# Patient Record
Sex: Female | Born: 1990 | Race: White | Hispanic: No | Smoking: Former smoker
Health system: Southern US, Community
[De-identification: ages and names within clinical notes are randomized; demographics above are authoritative.]

## PROBLEM LIST (undated history)

## (undated) DIAGNOSIS — K219 Gastro-esophageal reflux disease without esophagitis: Secondary | ICD-10-CM

## (undated) DIAGNOSIS — K5792 Diverticulitis of intestine, part unspecified, without perforation or abscess without bleeding: Secondary | ICD-10-CM

## (undated) DIAGNOSIS — N201 Calculus of ureter: Secondary | ICD-10-CM

## (undated) DIAGNOSIS — R319 Hematuria, unspecified: Secondary | ICD-10-CM

## (undated) DIAGNOSIS — R109 Unspecified abdominal pain: Secondary | ICD-10-CM

## (undated) DIAGNOSIS — R3915 Urgency of urination: Secondary | ICD-10-CM

## (undated) DIAGNOSIS — T7840XA Allergy, unspecified, initial encounter: Secondary | ICD-10-CM

## (undated) DIAGNOSIS — R350 Frequency of micturition: Secondary | ICD-10-CM

## (undated) DIAGNOSIS — R351 Nocturia: Secondary | ICD-10-CM

## (undated) HISTORY — DX: Gastro-esophageal reflux disease without esophagitis: K21.9

## (undated) HISTORY — DX: Allergy, unspecified, initial encounter: T78.40XA

---
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
## 2010-10-06 HISTORY — PX: WISDOM TOOTH EXTRACTION: SHX21

## 2012-01-05 HISTORY — PX: EXTRACORPOREAL SHOCK WAVE LITHOTRIPSY: SHX1557

## 2012-04-23 ENCOUNTER — Other Ambulatory Visit: Payer: Self-pay | Admitting: Family Medicine

## 2012-04-23 DIAGNOSIS — M545 Low back pain, unspecified: Secondary | ICD-10-CM

## 2012-05-03 ENCOUNTER — Ambulatory Visit
Admission: RE | Admit: 2012-05-03 | Discharge: 2012-05-03 | Disposition: A | Discharge status: Home / Self Care | Payer: No Typology Code available for payment source | Source: Ambulatory Visit | Attending: Family Medicine | Admitting: Family Medicine

## 2012-05-03 DIAGNOSIS — M545 Low back pain, unspecified: Secondary | ICD-10-CM

## 2012-05-26 ENCOUNTER — Other Ambulatory Visit: Payer: Self-pay | Admitting: Urology

## 2012-06-08 NOTE — Progress Notes (Signed)
PT STATES THINKS SHE HAS PASSED STONE. SHE CALLING OFFICE TODAY. WE NEED TO CALL BACK ON Thursday 06-10-2012 IF CASE NOT CANCELLED YET.

## 2012-06-11 ENCOUNTER — Encounter (HOSPITAL_BASED_OUTPATIENT_CLINIC_OR_DEPARTMENT_OTHER): Payer: Self-pay | Admitting: *Deleted

## 2012-06-11 NOTE — Progress Notes (Signed)
NPO AFTER MN. ARRIVES AT 0600. NEEDS KUB, HG AND URINE PREG. MAY TAKE HYDROCODONE IF NEEDED W/ SIP OF WATER AM OF SURG.

## 2012-06-12 NOTE — H&P (Signed)
History of Present Illness        Nephrolithiasis: She reported low back pain with radiation into the abdomen as well as associated nausea, vomiting and hematuria. When she was evaluated on 04/22/12 her urinalysis was clear microscopically of blood cells. A CT scan done on 04/23/12 revealed a punctate right renal calculus of no clinical significance as well as a 2.5 mm calcification located near the left ureterovesical junction without associated hydronephrosis. It is difficult to tell whether this is a phlebolith or a distal ureteral stone. She reports that she began having abdominal pain in 6/13. This was associated with gross hematuria. Her gross hematuria seems to of cleared but she now has developed significant frequency, a feeling of needing to strain to urinate at times as well as pain in her lower back on the left-hand side. She also had associated nausea and vomiting initially but that has resolved. She did have a stone in 2/13 that required stenting and lithotripsy on the left-hand side in [facility].  Her CT scan also revealed what was described by the radiologist as a possible calcification in the area of the Bartholin's gland. She denies any pain in this region.   Past Medical History Problems  1. History of  Nephrolithiasis V13.01 2. History of  No Medical Problems  Surgical History Problems  1. History of  Lithotripsy 2. History of  Lithotripsy 3. History of  Oral Surgery Tooth Extraction  Current Meds 1. No Medications; Therapy: (Recorded:19Aug2013) to  Allergies Medication  1. No Known Drug Allergies  Family History Problems  1. Family history of  Breast Cancer V16.3 2. Paternal grandmother's history of  Breast Cancer V16.3 3. Maternal grandfather's history of  Esophageal Cancer V16.0 4. Family history of  Family Health Status Of Father - Alive 5. Family history of  Family Health Status Of Mother - Alive 6. Maternal uncle's history of  Malignant Melanoma Of The Skin  V16.8 7. Maternal history of  Nephrolithiasis  Social History Problems    Alcohol Use   Caffeine Use   Former Smoker V15.82   Marital History - Single  Review of Systems Genitourinary, constitutional, skin, eye, otolaryngeal, hematologic/lymphatic, cardiovascular, pulmonary, endocrine, musculoskeletal, gastrointestinal, neurological and psychiatric system(s) were reviewed and pertinent findings if present are noted.  Genitourinary: urinary frequency, urinary urgency, dysuria, nocturia, incontinence, urinary hesitancy, urinary stream starts and stops, hematuria, dyspareunia and initiating urination requires straining.  Gastrointestinal: nausea and constipation.  Constitutional: feeling tired (fatigue) and recent weight loss.  Hematologic/Lymphatic: a tendency to easily bruise.  Cardiovascular: chest pain.  Respiratory: shortness of breath.  Musculoskeletal: back pain.  Neurological: dizziness and headache.    Vitals Vital Signs  BMI Calculated: 23.79 BSA Calculated: 1.55 Height: 5 ft 1 in Weight: 126 lb  Blood Pressure: 96 / 62 Temperature: 97.7 F Heart Rate: 57  Physical Exam Constitutional: Well nourished and well developed . No acute distress.  ENT:. The ears and nose are normal in appearance.  Neck: The appearance of the neck is normal and no neck mass is present.  Pulmonary: No respiratory distress and normal respiratory rhythm and effort.  Cardiovascular: Heart rate and rhythm are normal . No peripheral edema.  Abdomen: The abdomen is soft and nontender. No masses are palpated. No CVA tenderness. No hernias are palpable. No hepatosplenomegaly noted.  Lymphatics: The femoral and inguinal nodes are not enlarged or tender.  Skin: Normal skin turgor, no visible rash and no visible skin lesions.  Neuro/Psych:. Mood and affect are  appropriate.  Genitourinary:  Chaperone Present: .  Examination of the external genitalia shows normal female external genitalia and no  lesions. The urethra is normal in appearance and not tender. There is no urethral mass. Vaginal exam demonstrates no abnormalities. The adnexa are palpably normal. The bladder is non tender and not distended. The anus is normal on inspection. The perineum is normal on inspection.    Results/Data Urine [Data Includes: Last 1 Day]   19Aug2013  COLOR YELLOW   APPEARANCE CLEAR   SPECIFIC GRAVITY 1.010   pH 6.0   GLUCOSE NEG mg/dL  BILIRUBIN NEG   KETONE NEG mg/dL  BLOOD TRACE   PROTEIN NEG mg/dL  UROBILINOGEN 0.2 mg/dL  NITRITE NEG   LEUKOCYTE ESTERASE TRACE   SQUAMOUS EPITHELIAL/HPF RARE   WBC NONE SEEN WBC/hpf  RBC 0-2 RBC/hpf  BACTERIA NONE SEEN   CRYSTALS NONE SEEN   CASTS NONE SEEN    Old records or history reviewed: Notes from Dr. Langston Reusing office as above.  The following images/tracing/specimen were independently visualized:  CT scan as above.  The following clinical lab reports were reviewed:  Urinalysis as above.  The following radiology reports were reviewed: CT scan.    Assessment Assessed  1. Nephrolithiasis Of The Right Kidney 592.0 2. Possible  Distal Ureteral Stone On The Left 592.1         Her urinalysis today is completely clear. However she continues to have significant irritative voiding symptoms and also has had symptoms very suggestive of the passage of a stone. The calcification in her lower pelvis on the left-hand side near the UVJ could potentially be a phlebolith but with her voiding symptoms I think it's entirely possible that it is a distal ureteral stone. I have proposed either performing a CT scan with contrast which would allow me to diagnose the calcification is a stone versus perform a retrograde pyelogram and if it was a stone treat the stone at that time with extraction and possible stent. I found no abnormality whatsoever on physical examination in the area of the Bartholin's gland/Skene's gland region that was noted as a calcification on her CT  scan. She has elected to proceed with cystoscopy, retrograde pyelogram and stone extraction if the calcification in her pelvis left-hand side is in fact a stone which I think it's probably going to be. We discussed the possible need for a stent and also the ability to evaluate the bladder for possible irritative focus if this is not a stone.   Plan    She'll be scheduled for cystoscopy with left retrograde pyelogram, left ureteroscopy and stone extraction as indicated.

## 2012-06-14 ENCOUNTER — Ambulatory Visit (HOSPITAL_COMMUNITY): Payer: BC Managed Care – PPO

## 2012-06-14 ENCOUNTER — Encounter (HOSPITAL_BASED_OUTPATIENT_CLINIC_OR_DEPARTMENT_OTHER): Payer: Self-pay | Admitting: Anesthesiology

## 2012-06-14 ENCOUNTER — Encounter (HOSPITAL_BASED_OUTPATIENT_CLINIC_OR_DEPARTMENT_OTHER): Payer: Self-pay | Admitting: *Deleted

## 2012-06-14 ENCOUNTER — Ambulatory Visit (HOSPITAL_BASED_OUTPATIENT_CLINIC_OR_DEPARTMENT_OTHER): Payer: BC Managed Care – PPO | Admitting: Anesthesiology

## 2012-06-14 ENCOUNTER — Encounter (HOSPITAL_BASED_OUTPATIENT_CLINIC_OR_DEPARTMENT_OTHER)
Admission: RE | Disposition: A | Discharge status: Home / Self Care | Payer: Self-pay | Source: Ambulatory Visit | Attending: Urology

## 2012-06-14 ENCOUNTER — Ambulatory Visit (HOSPITAL_BASED_OUTPATIENT_CLINIC_OR_DEPARTMENT_OTHER)
Admission: RE | Admit: 2012-06-14 | Discharge: 2012-06-14 | Disposition: A | Discharge status: Home / Self Care | Payer: BC Managed Care – PPO | Source: Ambulatory Visit | Attending: Urology | Admitting: Urology

## 2012-06-14 DIAGNOSIS — R112 Nausea with vomiting, unspecified: Secondary | ICD-10-CM | POA: Insufficient documentation

## 2012-06-14 DIAGNOSIS — M545 Low back pain, unspecified: Secondary | ICD-10-CM | POA: Insufficient documentation

## 2012-06-14 DIAGNOSIS — R319 Hematuria, unspecified: Secondary | ICD-10-CM | POA: Insufficient documentation

## 2012-06-14 DIAGNOSIS — N201 Calculus of ureter: Secondary | ICD-10-CM

## 2012-06-14 DIAGNOSIS — I998 Other disorder of circulatory system: Secondary | ICD-10-CM | POA: Insufficient documentation

## 2012-06-14 HISTORY — DX: Unspecified abdominal pain: R10.9

## 2012-06-14 HISTORY — DX: Calculus of ureter: N20.1

## 2012-06-14 HISTORY — DX: Frequency of micturition: R35.0

## 2012-06-14 HISTORY — PX: CYSTOSCOPY WITH URETEROSCOPY: SHX5123

## 2012-06-14 HISTORY — DX: Urgency of urination: R39.15

## 2012-06-14 HISTORY — DX: Nocturia: R35.1

## 2012-06-14 HISTORY — PX: CYSTOSCOPY W/ RETROGRADES: SHX1426

## 2012-06-14 HISTORY — DX: Hematuria, unspecified: R31.9

## 2012-06-14 LAB — POCT PREGNANCY, URINE: Preg Test, Ur: NEGATIVE

## 2012-06-14 LAB — POCT HEMOGLOBIN-HEMACUE: Hemoglobin: 13.6 g/dL (ref 12.0–15.0)

## 2012-06-14 SURGERY — CYSTOSCOPY, WITH RETROGRADE PYELOGRAM
Anesthesia: General | Site: Ureter | Laterality: Left | Wound class: Clean Contaminated

## 2012-06-14 MED ORDER — ONDANSETRON HCL 4 MG/2ML IJ SOLN
INTRAMUSCULAR | Status: DC | PRN
  Administered 2012-06-14: 4 mg via INTRAVENOUS

## 2012-06-14 MED ORDER — DEXAMETHASONE SODIUM PHOSPHATE 4 MG/ML IJ SOLN
INTRAMUSCULAR | Status: DC | PRN
  Administered 2012-06-14: 10 mg via INTRAVENOUS

## 2012-06-14 MED ORDER — CIPROFLOXACIN IN D5W 200 MG/100ML IV SOLN
200.0000 mg | INTRAVENOUS | Status: AC
  Administered 2012-06-14: 200 mg via INTRAVENOUS

## 2012-06-14 MED ORDER — FENTANYL CITRATE 0.05 MG/ML IJ SOLN
INTRAMUSCULAR | Status: DC | PRN
  Administered 2012-06-14: 50 ug via INTRAVENOUS

## 2012-06-14 MED ORDER — IOHEXOL 350 MG/ML SOLN
INTRAVENOUS | Status: DC | PRN
  Administered 2012-06-14: 6 mL

## 2012-06-14 MED ORDER — MEPERIDINE HCL 25 MG/ML IJ SOLN: 6.2500 mg | INTRAMUSCULAR | Status: DC | PRN

## 2012-06-14 MED ORDER — MIDAZOLAM HCL 5 MG/5ML IJ SOLN
INTRAMUSCULAR | Status: DC | PRN
  Administered 2012-06-14: 2 mg via INTRAVENOUS

## 2012-06-14 MED ORDER — LACTATED RINGERS IV SOLN: INTRAVENOUS | Status: DC

## 2012-06-14 MED ORDER — LACTATED RINGERS IV SOLN
INTRAVENOUS | Status: DC
  Administered 2012-06-14 (×2): via INTRAVENOUS

## 2012-06-14 MED ORDER — FENTANYL CITRATE 0.05 MG/ML IJ SOLN: 25.0000 ug | INTRAMUSCULAR | Status: DC | PRN

## 2012-06-14 MED ORDER — PROPOFOL 10 MG/ML IV BOLUS
INTRAVENOUS | Status: DC | PRN
  Administered 2012-06-14: 140 mg via INTRAVENOUS

## 2012-06-14 MED ORDER — LIDOCAINE HCL (CARDIAC) 20 MG/ML IV SOLN
INTRAVENOUS | Status: DC | PRN
  Administered 2012-06-14: 70 mg via INTRAVENOUS

## 2012-06-14 MED ORDER — PHENAZOPYRIDINE HCL 200 MG PO TABS: 200.0000 mg | ORAL_TABLET | Freq: Once | ORAL | Status: DC

## 2012-06-14 MED ORDER — PROMETHAZINE HCL 25 MG/ML IJ SOLN: 6.2500 mg | INTRAMUSCULAR | Status: DC | PRN

## 2012-06-14 MED ORDER — SODIUM CHLORIDE 0.9 % IR SOLN
Status: DC | PRN
  Administered 2012-06-14: 6000 mL

## 2012-06-14 MED ORDER — HYDROCODONE-ACETAMINOPHEN 5-325 MG PO TABS
1.0000 | ORAL_TABLET | Freq: Four times a day (QID) | ORAL | Status: DC | PRN
  Administered 2012-06-14: 0.5 via ORAL

## 2012-06-14 SURGICAL SUPPLY — 43 items
ADAPTER CATH URET PLST 4-6FR (CATHETERS) ×2 IMPLANT
ADPR CATH URET STRL DISP 4-6FR (CATHETERS) ×2
BAG DRAIN URO-CYSTO SKYTR STRL (DRAIN) ×3 IMPLANT
BAG DRN UROCATH (DRAIN) ×2
BASKET LASER NITINOL 1.9FR (BASKET) IMPLANT
BASKET SEGURA 3FR (UROLOGICAL SUPPLIES) IMPLANT
BASKET STNLS GEMINI 4WIRE 3FR (BASKET) IMPLANT
BASKET ZERO TIP NITINOL 2.4FR (BASKET) IMPLANT
BRUSH URET BIOPSY 3F (UROLOGICAL SUPPLIES) IMPLANT
BSKT STON RTRVL 120 1.9FR (BASKET)
BSKT STON RTRVL GEM 120X11 3FR (BASKET)
BSKT STON RTRVL ZERO TP 2.4FR (BASKET)
CANISTER SUCT LVC 12 LTR MEDI- (MISCELLANEOUS) ×2 IMPLANT
CATH INTERMIT  6FR 70CM (CATHETERS) ×2 IMPLANT
CATH URET 5FR 28IN CONE TIP (BALLOONS)
CATH URET 5FR 70CM CONE TIP (BALLOONS) IMPLANT
CLOTH BEACON ORANGE TIMEOUT ST (SAFETY) ×3 IMPLANT
DRAPE CAMERA CLOSED 9X96 (DRAPES) ×3 IMPLANT
ELECT REM PT RETURN 9FT ADLT (ELECTROSURGICAL)
ELECTRODE REM PT RTRN 9FT ADLT (ELECTROSURGICAL) IMPLANT
GLOVE BIO SURGEON STRL SZ 6.5 (GLOVE) ×2 IMPLANT
GLOVE BIO SURGEON STRL SZ8 (GLOVE) ×3 IMPLANT
GLOVE ECLIPSE 6.0 STRL STRAW (GLOVE) ×2 IMPLANT
GOWN PREVENTION PLUS LG XLONG (DISPOSABLE) ×3 IMPLANT
GOWN STRL REIN XL XLG (GOWN DISPOSABLE) ×3 IMPLANT
GOWN XL W/COTTON TOWEL STD (GOWNS) ×1 IMPLANT
GUIDEWIRE 0.038 PTFE COATED (WIRE) ×3 IMPLANT
GUIDEWIRE ANG ZIPWIRE 038X150 (WIRE) IMPLANT
GUIDEWIRE STR DUAL SENSOR (WIRE) ×3 IMPLANT
IV NS IRRIG 3000ML ARTHROMATIC (IV SOLUTION) ×6 IMPLANT
KIT BALLIN UROMAX 15FX10 (LABEL) IMPLANT
KIT BALLN UROMAX 15FX4 (MISCELLANEOUS) IMPLANT
KIT BALLN UROMAX 26 75X4 (MISCELLANEOUS)
LASER FIBER DISP (UROLOGICAL SUPPLIES) IMPLANT
LASER FIBER DISP 1000U (UROLOGICAL SUPPLIES) IMPLANT
PACK CYSTOSCOPY (CUSTOM PROCEDURE TRAY) ×3 IMPLANT
SET HIGH PRES BAL DIL (LABEL)
SHEATH ACCESS URETERAL 38CM (SHEATH) IMPLANT
SHEATH ACCESS URETERAL 54CM (SHEATH) IMPLANT
SHEATH URET ACCESS 12FR/35CM (UROLOGICAL SUPPLIES) IMPLANT
SHEATH URET ACCESS 12FR/55CM (UROLOGICAL SUPPLIES) IMPLANT
SYRINGE IRR TOOMEY STRL 70CC (SYRINGE) IMPLANT
WATER STERILE IRR 3000ML UROMA (IV SOLUTION) IMPLANT

## 2012-06-14 NOTE — Anesthesia Preprocedure Evaluation (Addendum)
Anesthesia Evaluation  Patient identified by MRN, date of birth, ID band Patient awake    Reviewed: Allergy & Precautions, H&P , NPO status , Patient's Chart, lab work & pertinent test results  Airway Mallampati: II TM Distance: >3 FB Neck ROM: full    Dental No notable dental hx.    Pulmonary neg pulmonary ROS,  breath sounds clear to auscultation  Pulmonary exam normal       Cardiovascular Exercise Tolerance: Good negative cardio ROS  Rhythm:regular Rate:Normal     Neuro/Psych negative neurological ROS  negative psych ROS   GI/Hepatic negative GI ROS, Neg liver ROS,   Endo/Other  negative endocrine ROS  Renal/GU negative Renal ROS  negative genitourinary   Musculoskeletal   Abdominal   Peds  Hematology negative hematology ROS (+)   Anesthesia Other Findings   Reproductive/Obstetrics negative OB ROS                           Anesthesia Physical Anesthesia Plan  ASA: I  Anesthesia Plan: General LMA   Post-op Pain Management:    Induction:   Airway Management Planned:   Additional Equipment:   Intra-op Plan:   Post-operative Plan:   Informed Consent: I have reviewed the patients History and Physical, chart, labs and discussed the procedure including the risks, benefits and alternatives for the proposed anesthesia with the patient or authorized representative who has indicated his/her understanding and acceptance.   Dental Advisory Given  Plan Discussed with: CRNA  Anesthesia Plan Comments:         Anesthesia Quick Evaluation  

## 2012-06-14 NOTE — Op Note (Signed)
PATIENT:  Alexandra Cortez  PRE-OPERATIVE DIAGNOSIS:  Left Ureteral calculus  POST-OPERATIVE DIAGNOSIS: Left pelvic Phlebolith  PROCEDURE:  1. Cystoscopy. 2. Left retrograde pyelogram with interpretation 3. Left ureteroscopy  SURGEON: Garnett Farm, MD  INDICATION: Alexandra Cortez is a 21 year old female who had experienced irritative voiding symptoms as well as low back pain with associated nausea, vomiting and hematuria. A CT scan done on 04/23/12 revealed a punctate right renal calculus as well as a 2.5 mm calcification near the left ureteral vesicle junction without associated hydronephrosis. Followup KUB revealed a calcification was unchanged. She therefore is brought to the operating room for retrograde pyelogram, ureteroscopy and stone extraction if necessary.  ANESTHESIA:  General  EBL:  None   DESCRIPTION OF PROCEDURE: The patient was taken to the major OR and placed on the table. General anesthesia was administered and then the patient was moved to the dorsal lithotomy position. The genitalia was sterilely prepped and draped. An official timeout was performed.  Initially the 22 French cystoscope with 12 lens was passed under direct vision. The bladder was fully inspected. It was noted be free of any tumors stones or inflammatory lesions. Ureteral orifices were of normal configuration and position. A 6 French open-ended ureteral catheter was then passed through the cystoscope into the ureteral orifice in order to perform a left retrograde pyelogram.  A retrograde pyelogram was performed by injecting full-strength contrast up the left ureter under direct fluoroscopic control. It revealed what appeared to be an entirely normal ureter throughout its course. I could not definitely discern any filling defect although the calcification seen on her KUB was not easily visible under fluoroscopy. I therefore elected to proceed with diagnostic ureteroscopy.  The 6 French rigid ureteroscope was  then passed under direct vision into the bladder. The left ureteral orifice easily accepted the scope without the need for any dilatation. I advanced the scope under direct vision with minimal flow and noted no stones or other abnormalities within the ureter. Ureteroscope was therefore removed, the bladder drained and the patient was awakened and taken to recovery room in stable and satisfactory condition. She tolerated the procedure well with no intraoperative complications.  PLAN OF CARE: Discharge to home after PACU  PATIENT DISPOSITION:  PACU - hemodynamically stable.

## 2012-06-14 NOTE — Interval H&P Note (Signed)
History and Physical Interval Note:  06/14/2012 7:26 AM  Alexandra Cortez  has presented today for surgery, with the diagnosis of Possible Left Ureteral Stone  The various methods of treatment have been discussed with the patient and family. After consideration of risks, benefits and other options for treatment, the patient has consented to  Procedure(s) (LRB) with comments: CYSTOSCOPY/RETROGRADE/URETEROSCOPY (Left) - 1 hour requested for this case  C-ARM CAMERA DIGITAL URETEROSCOPE HOLMIUM LASER APPLICATION (Left) as a surgical intervention .  The patient's history has been reviewed, patient examined, no change in status, stable for surgery.  I have reviewed the patient's chart and labs.  Questions were answered to the patient's satisfaction.     Garnett Farm

## 2012-06-14 NOTE — Anesthesia Procedure Notes (Signed)
Procedure Name: LMA Insertion Date/Time: 06/14/2012 7:32 AM Performed by: Maris Berger T Pre-anesthesia Checklist: Patient identified, Emergency Drugs available, Suction available and Patient being monitored Patient Re-evaluated:Patient Re-evaluated prior to inductionOxygen Delivery Method: Circle System Utilized Preoxygenation: Pre-oxygenation with 100% oxygen Intubation Type: IV induction Ventilation: Mask ventilation without difficulty LMA: LMA inserted LMA Size: 4.0 Number of attempts: 1 Placement Confirmation: positive ETCO2 Dental Injury: Teeth and Oropharynx as per pre-operative assessment  Comments: Gauze roll between teeth

## 2012-06-14 NOTE — Anesthesia Postprocedure Evaluation (Signed)
  Anesthesia Post-op Note  Patient: Alexandra Cortez  Procedure(s) Performed: Procedure(s) (LRB): CYSTOSCOPY WITH RETROGRADE PYELOGRAM (Left) CYSTOSCOPY WITH URETEROSCOPY (Left)  Patient Location: PACU  Anesthesia Type: General  Level of Consciousness: awake and alert   Airway and Oxygen Therapy: Patient Spontanous Breathing  Post-op Pain: mild  Post-op Assessment: Post-op Vital signs reviewed, Patient's Cardiovascular Status Stable, Respiratory Function Stable, Patent Airway and No signs of Nausea or vomiting  Post-op Vital Signs: stable  Complications: No apparent anesthesia complications

## 2012-06-14 NOTE — Transfer of Care (Signed)
Immediate Anesthesia Transfer of Care Note  Patient: Alexandra Cortez  Procedure(s) Performed: Procedure(s) (LRB) with comments: CYSTOSCOPY WITH RETROGRADE PYELOGRAM (Left) CYSTOSCOPY WITH URETEROSCOPY (Left)  Patient Location: PACU  Anesthesia Type: General  Level of Consciousness: unresponsive  Airway & Oxygen Therapy: Patient Spontanous Breathing and Patient connected to nasal cannula oxygen  Post-op Assessment: Report given to PACU RN and Post -op Vital signs reviewed and stable  Post vital signs: Reviewed and stable  Complications: No apparent anesthesia complications

## 2012-06-15 ENCOUNTER — Encounter (HOSPITAL_BASED_OUTPATIENT_CLINIC_OR_DEPARTMENT_OTHER): Payer: Self-pay | Admitting: Urology

## 2012-06-17 ENCOUNTER — Encounter (HOSPITAL_BASED_OUTPATIENT_CLINIC_OR_DEPARTMENT_OTHER): Payer: Self-pay

## 2012-07-20 ENCOUNTER — Other Ambulatory Visit: Payer: Self-pay | Admitting: Family Medicine

## 2012-07-20 DIAGNOSIS — R591 Generalized enlarged lymph nodes: Secondary | ICD-10-CM

## 2012-07-22 ENCOUNTER — Ambulatory Visit
Admission: RE | Admit: 2012-07-22 | Discharge: 2012-07-22 | Disposition: A | Discharge status: Home / Self Care | Payer: BC Managed Care – PPO | Source: Ambulatory Visit | Attending: Family Medicine | Admitting: Family Medicine

## 2012-07-22 DIAGNOSIS — R591 Generalized enlarged lymph nodes: Secondary | ICD-10-CM

## 2012-07-30 ENCOUNTER — Other Ambulatory Visit (HOSPITAL_COMMUNITY): Payer: Self-pay | Admitting: Family Medicine

## 2012-07-30 DIAGNOSIS — R599 Enlarged lymph nodes, unspecified: Secondary | ICD-10-CM

## 2012-08-02 ENCOUNTER — Other Ambulatory Visit: Payer: Self-pay | Admitting: Radiology

## 2012-08-03 ENCOUNTER — Encounter (HOSPITAL_COMMUNITY): Payer: Self-pay | Admitting: Pharmacy Technician

## 2012-08-03 ENCOUNTER — Encounter (HOSPITAL_COMMUNITY): Payer: Self-pay

## 2012-08-03 ENCOUNTER — Ambulatory Visit (HOSPITAL_COMMUNITY)
Admission: RE | Admit: 2012-08-03 | Discharge: 2012-08-03 | Disposition: A | Discharge status: Home / Self Care | Payer: BC Managed Care – PPO | Source: Ambulatory Visit | Attending: Family Medicine | Admitting: Family Medicine

## 2012-08-03 DIAGNOSIS — R599 Enlarged lymph nodes, unspecified: Secondary | ICD-10-CM

## 2012-08-03 LAB — PROTIME-INR: Prothrombin Time: 14.1 seconds (ref 11.6–15.2)

## 2012-08-03 LAB — CBC
HCT: 40.6 % (ref 36.0–46.0)
Hemoglobin: 13.6 g/dL (ref 12.0–15.0)
MCH: 29.1 pg (ref 26.0–34.0)
MCV: 86.9 fL (ref 78.0–100.0)
RBC: 4.67 MIL/uL (ref 3.87–5.11)

## 2012-08-03 MED ORDER — FENTANYL CITRATE 0.05 MG/ML IJ SOLN
INTRAMUSCULAR | Status: AC | PRN
  Administered 2012-08-03: 50 ug via INTRAVENOUS

## 2012-08-03 MED ORDER — FENTANYL CITRATE 0.05 MG/ML IJ SOLN
INTRAMUSCULAR | Status: AC
  Filled 2012-08-03: qty 4

## 2012-08-03 MED ORDER — MIDAZOLAM HCL 2 MG/2ML IJ SOLN
INTRAMUSCULAR | Status: AC
  Filled 2012-08-03: qty 4

## 2012-08-03 MED ORDER — MIDAZOLAM HCL 2 MG/2ML IJ SOLN
INTRAMUSCULAR | Status: AC | PRN
  Administered 2012-08-03: 1 mg via INTRAVENOUS

## 2012-08-03 MED ORDER — SODIUM CHLORIDE 0.9 % IV SOLN
INTRAVENOUS | Status: DC
  Administered 2012-08-03: 13:00:00 via INTRAVENOUS

## 2012-08-03 NOTE — H&P (Signed)
Alexandra Cortez is an 21 y.o. female.   Chief Complaint: pt has noticed Rt Inguinal LN approx 4-6 weeks ago Initially painful; not for few weeks; smaller CT and [place] in Epic reveals groin LAN Scheduled for lymph node biopsy  HPI: smoker; hx renal calculi  Past Medical History  Diagnosis Date  . Left ureteral calculus   . Frequency of urination   . Urgency of urination   . Hematuria   . Nocturia   . Left flank pain     Past Surgical History  Procedure Date  . Extracorporeal shock wave lithotripsy APR 2013    LEFT SIDE--  [facility]   . Wisdom tooth extraction 2012  . Cystoscopy w/ retrogrades 06/14/2012    Procedure: CYSTOSCOPY WITH RETROGRADE PYELOGRAM;  Surgeon: Garnett Farm, MD;  Location: [facility];  Service: Urology;  Laterality: Left;  . Cystoscopy with ureteroscopy 06/14/2012    Procedure: CYSTOSCOPY WITH URETEROSCOPY;  Surgeon: Garnett Farm, MD;  Location: [facility];  Service: Urology;  Laterality: Left;    No family history on file. Social History:  reports that she has been smoking Cigarettes.  She has a .3 pack-year smoking history. She has never used smokeless tobacco. She reports that she drinks alcohol. She reports that she uses illicit drugs (Marijuana).  Allergies: No Known Allergies   (Not in a hospital admission)  Results for orders placed during the hospital encounter of 08/03/12 (from the past 48 hour(s))  CBC     Status: Normal   Collection Time   08/03/12  1:13 PM      Component Value Range Comment   WBC 7.7  4.0 - 10.5 K/uL    RBC 4.67  3.87 - 5.11 MIL/uL    Hemoglobin 13.6  12.0 - 15.0 g/dL    HCT 08.6  57.8 - 46.9 %    MCV 86.9  78.0 - 100.0 fL    MCH 29.1  26.0 - 34.0 pg    MCHC 33.5  30.0 - 36.0 g/dL    RDW 62.9  52.8 - 41.3 %    Platelets 223  150 - 400 K/uL    No results found.  Review of Systems  Constitutional: Negative for fever.  Respiratory: Negative for shortness of breath.     Cardiovascular: Negative for chest pain.  Gastrointestinal: Negative for nausea, vomiting and abdominal pain.  Musculoskeletal:       Very small rt inguinal LN; NT  Neurological: Negative for headaches.    Blood pressure 99/54, pulse 71, temperature 98.4 F (36.9 C), temperature source Oral, resp. rate 20, height 5\' 1"  (1.549 m), weight 126 lb (57.153 kg), last menstrual period 06/03/2012, SpO2 100.00%. Physical Exam  Cardiovascular: Normal rate, regular rhythm and normal heart sounds.   No murmur heard. Respiratory: Effort normal and breath sounds normal. She has no wheezes.  GI: Soft. Bowel sounds are normal. There is no tenderness.  Musculoskeletal: Normal range of motion.       Rt inguinal LN; NT; 1cm  Neurological: She is alert.  Psychiatric: She has a normal mood and affect. Her behavior is normal. Judgment and thought content normal.     Assessment/Plan Noticed rt groin lymph node weeks ago; initially painful Not painful now and smaller CT and US reveals LAN Scheduled now for Rt groin LN bx Pt and family aware of procedure benefits and risks and agreeable to proceed Consent signed and in chart  Sheri Gatchel A 08/03/2012, 1:37 PM

## 2012-08-03 NOTE — H&P (Signed)
Agree with PA note.  Node highly likely benign and reactive given recent decrease in size.  Given easily accessible and low risk location, will proceed with biopsy to confirm.  Signed,  Sterling Big, MD Vascular & Interventional Radiologist [facility] Radiology

## 2012-08-04 ENCOUNTER — Telehealth (HOSPITAL_COMMUNITY): Payer: Self-pay | Admitting: *Deleted

## 2012-08-04 NOTE — Telephone Encounter (Signed)
Radiology post procedure phone call attempted.  Message oeft on identified answering to call for any problems or questions.

## 2013-05-17 ENCOUNTER — Other Ambulatory Visit: Payer: Self-pay | Admitting: Physician Assistant

## 2013-05-17 DIAGNOSIS — R51 Headache: Secondary | ICD-10-CM

## 2014-07-24 IMAGING — CT CT ABD-PELV W/O CM
1 of 2 series · 15 of 32 positions shown, 19 images · non-contrast
Comparison: None.

CLINICAL DATA: Low back pain and left flank pain.  Hematuria.
History of lithotripsy.

CT ABDOMEN AND PELVIS WITHOUT CONTRAST
TECHNIQUE: Multidetector CT imaging of the abdomen and pelvis was
performed following the standard protocol without intravenous
contrast.

[Series 3: renal stone · axial · 0.71mm/px · z∈[-342,+3]mm · 15 of 77 slices shown, 19 images]
[im 4/77  soft-tissue]
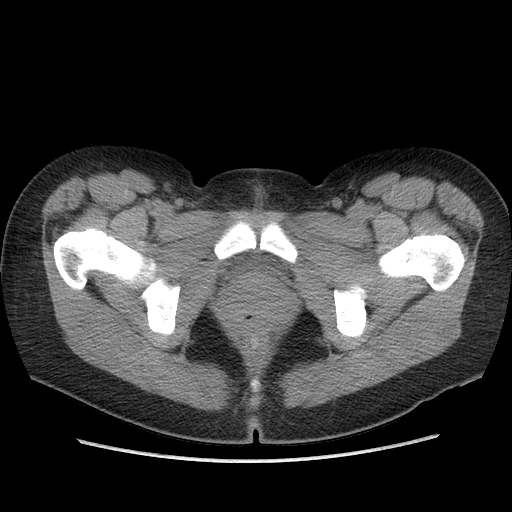
[im 4/77  bone]
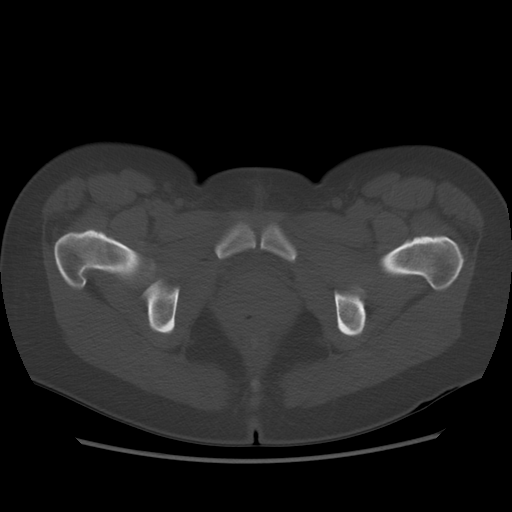
[im 10/77  soft-tissue]
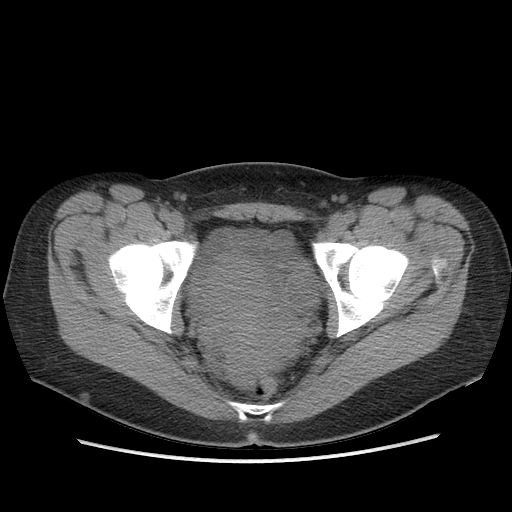
[im 16/77  soft-tissue]
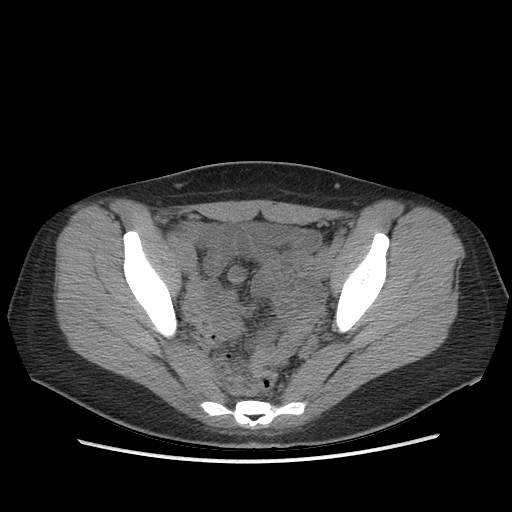
[im 22/77  soft-tissue]
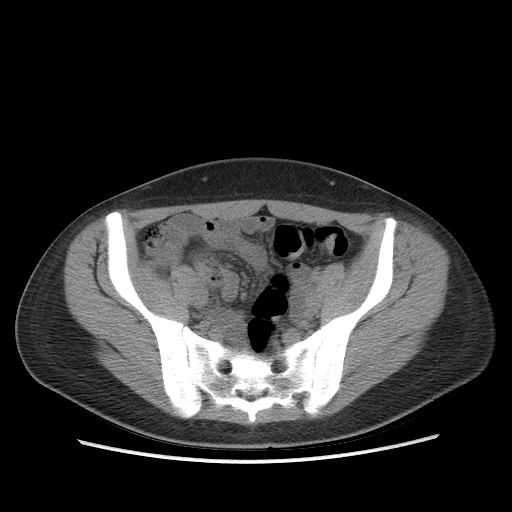
[im 28/77  soft-tissue]
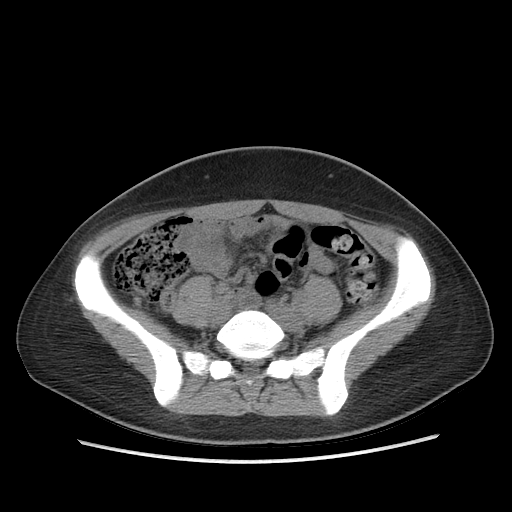
[im 34/77  soft-tissue]
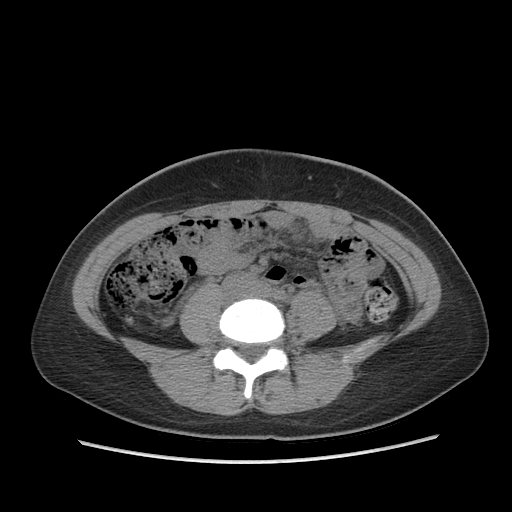
[im 40/77  soft-tissue]
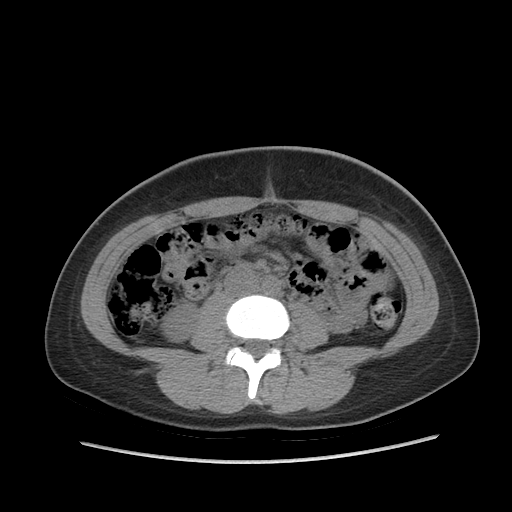
[im 43/77  soft-tissue]
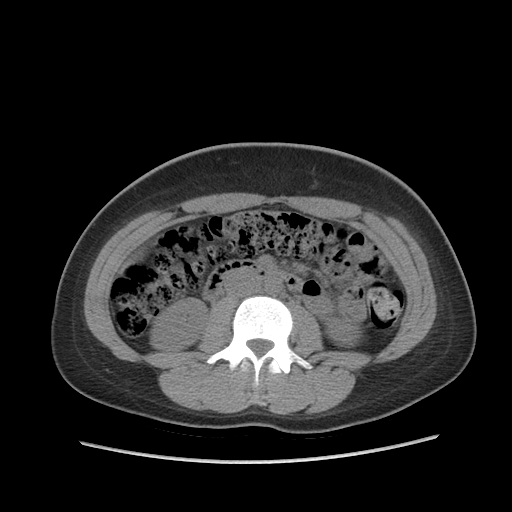
[im 49/77  soft-tissue]
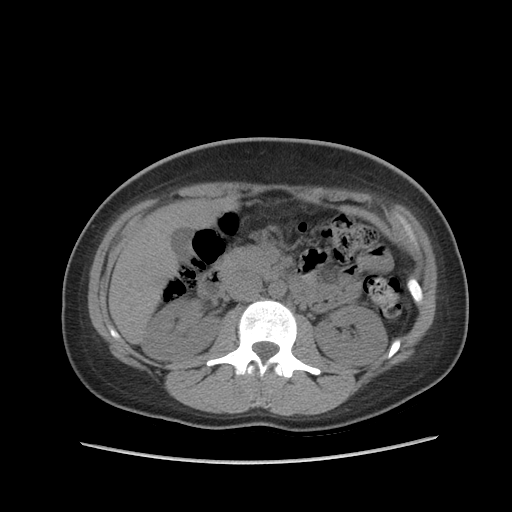
[im 49/77  bone]
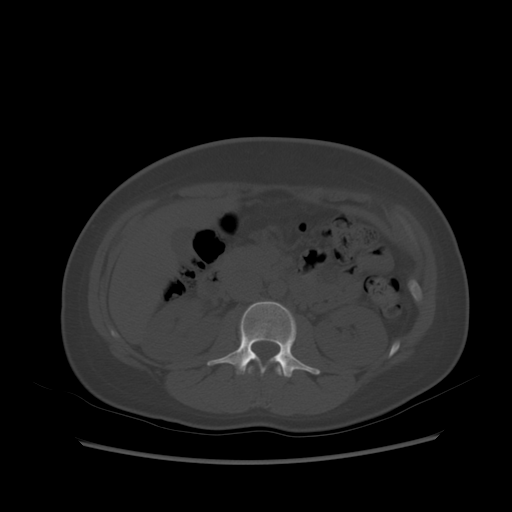
[im 55/77  soft-tissue]
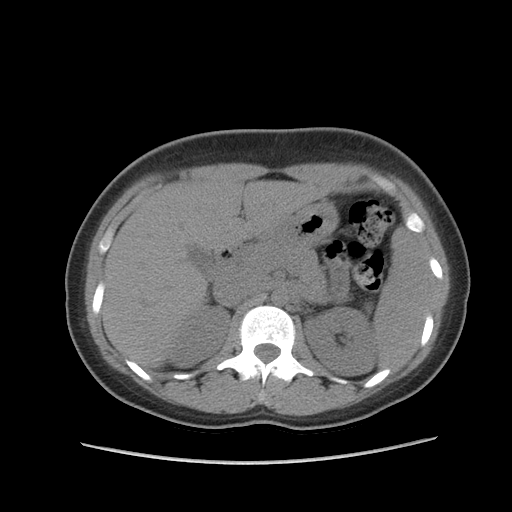
[im 61/77  soft-tissue]
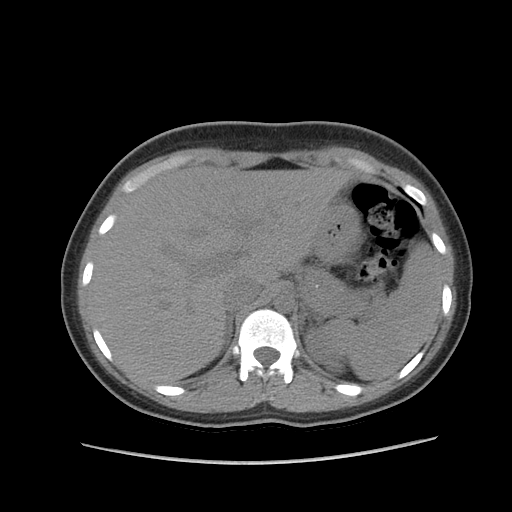
[im 64/77  lung]
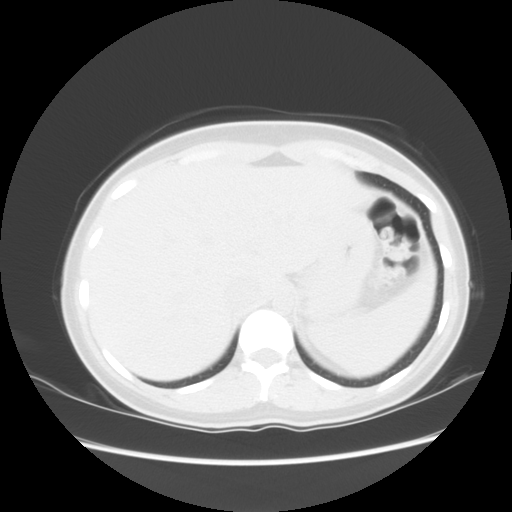
[im 67/77  soft-tissue]
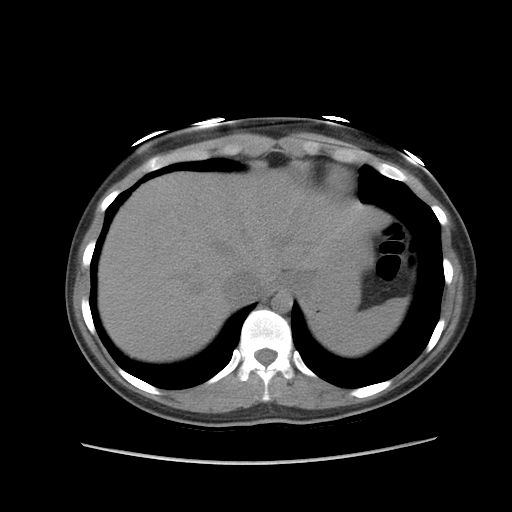
[im 67/77  lung]
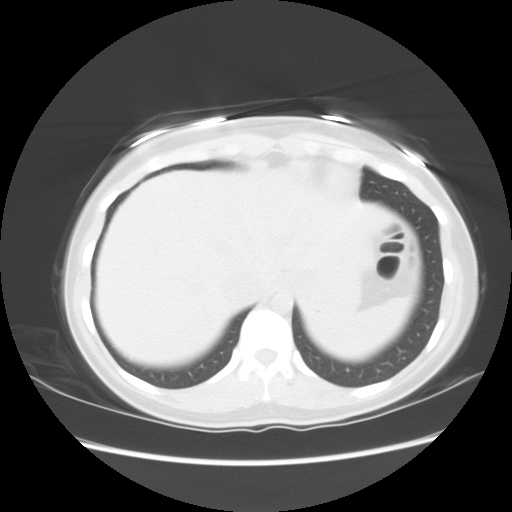
[im 70/77  lung]
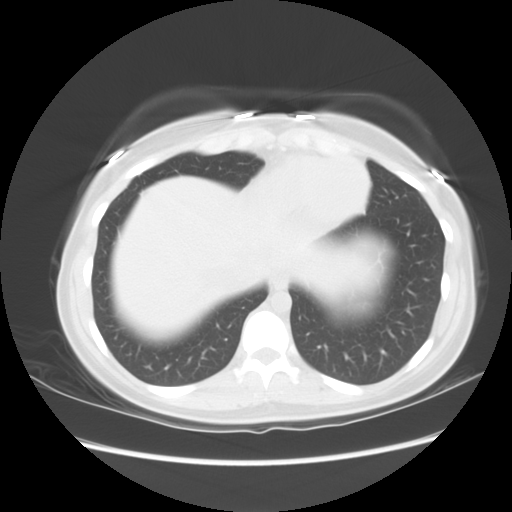
[im 73/77  soft-tissue]
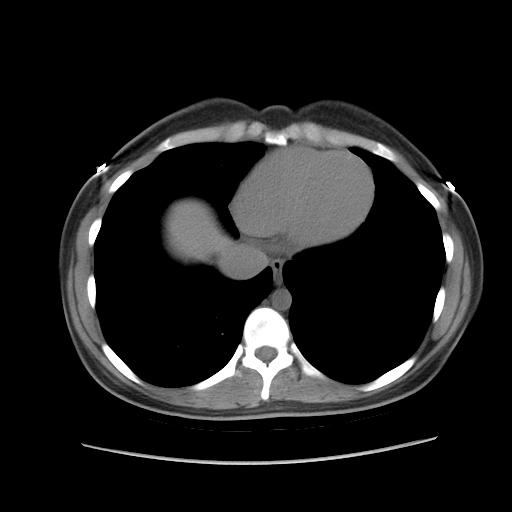
[im 73/77  lung]
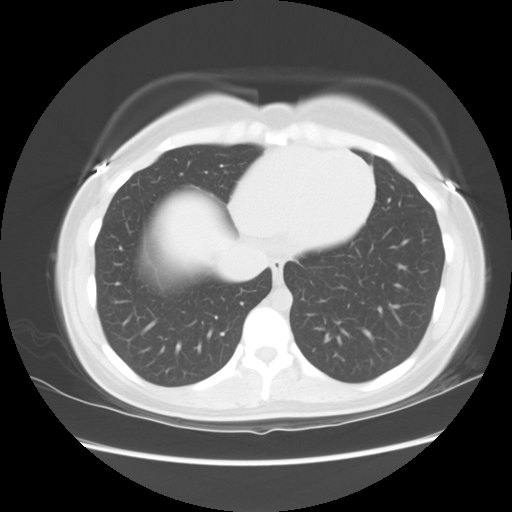

[15 of 32 positions shown; findings below may reference images not displayed]

FINDINGS: Lung bases show no acute findings.  Heart size normal.
No pericardial or pleural effusion.

Liver, gallbladder and adrenal glands unremarkable.  There is a
punctate stone in the right kidney (image 26).  Ureters are
decompressed.  Spleen, pancreas, stomach and bowel are
unremarkable.

There is a 3 mm calcification in the low left anatomic pelvis
(image 72), not definitively in the location of the left ureteral
vesicle junction.  Admittedly, the distal left ureter is not well
delineated.  Bladder is decompressed.  A second calcification is
seen more inferiorly, toward the midline, measuring 4 x 7 mm.

No definite free fluid.  No pathologically enlarged lymph nodes.
IMPRESSION: 1.  Probable phlebolith in the low left anatomic pelvis.  A left
ureteral vesicle junction stone cannot be definitively excluded.
No associated left hydronephrosis or edema.
2.  Punctate right renal stone.
3.  Small calcification in the region of a probable Bartholin's
gland cyst.

## 2015-04-15 ENCOUNTER — Encounter (HOSPITAL_COMMUNITY): Payer: Self-pay | Admitting: Emergency Medicine

## 2015-04-15 ENCOUNTER — Emergency Department (HOSPITAL_COMMUNITY): Payer: BLUE CROSS/BLUE SHIELD

## 2015-04-15 ENCOUNTER — Emergency Department (HOSPITAL_COMMUNITY)
Admission: EM | Admit: 2015-04-15 | Discharge: 2015-04-15 | Disposition: A | Discharge status: Home / Self Care | Payer: BLUE CROSS/BLUE SHIELD | Attending: Emergency Medicine | Admitting: Emergency Medicine

## 2015-04-15 DIAGNOSIS — R51 Headache: Secondary | ICD-10-CM | POA: Diagnosis present

## 2015-04-15 DIAGNOSIS — G43901 Migraine, unspecified, not intractable, with status migrainosus: Secondary | ICD-10-CM

## 2015-04-15 DIAGNOSIS — Z79899 Other long term (current) drug therapy: Secondary | ICD-10-CM | POA: Insufficient documentation

## 2015-04-15 DIAGNOSIS — Z720 Tobacco use: Secondary | ICD-10-CM | POA: Insufficient documentation

## 2015-04-15 DIAGNOSIS — Z87442 Personal history of urinary calculi: Secondary | ICD-10-CM | POA: Diagnosis not present

## 2015-04-15 DIAGNOSIS — G43001 Migraine without aura, not intractable, with status migrainosus: Secondary | ICD-10-CM | POA: Diagnosis not present

## 2015-04-15 DIAGNOSIS — R112 Nausea with vomiting, unspecified: Secondary | ICD-10-CM | POA: Diagnosis not present

## 2015-04-15 DIAGNOSIS — Z3202 Encounter for pregnancy test, result negative: Secondary | ICD-10-CM | POA: Diagnosis not present

## 2015-04-15 LAB — POC URINE PREG, ED: PREG TEST UR: NEGATIVE

## 2015-04-15 MED ORDER — SODIUM CHLORIDE 0.9 % IV BOLUS (SEPSIS)
1000.0000 mL | Freq: Once | INTRAVENOUS | Status: AC
  Administered 2015-04-15: 1000 mL via INTRAVENOUS

## 2015-04-15 MED ORDER — METOCLOPRAMIDE HCL 5 MG/ML IJ SOLN
10.0000 mg | Freq: Once | INTRAMUSCULAR | Status: AC
  Administered 2015-04-15: 10 mg via INTRAVENOUS
  Filled 2015-04-15: qty 2

## 2015-04-15 MED ORDER — DIPHENHYDRAMINE HCL 50 MG/ML IJ SOLN
25.0000 mg | Freq: Once | INTRAMUSCULAR | Status: AC
  Administered 2015-04-15: 25 mg via INTRAVENOUS
  Filled 2015-04-15: qty 1

## 2015-04-15 MED ORDER — MORPHINE SULFATE 4 MG/ML IJ SOLN
4.0000 mg | Freq: Once | INTRAMUSCULAR | Status: AC
  Administered 2015-04-15: 4 mg via INTRAVENOUS
  Filled 2015-04-15: qty 1

## 2015-04-15 NOTE — ED Notes (Signed)
Pt c/o headache onset Monday, pt states this differs from her typical migraine because when she lowers her head she feels extremely nauseated and her pain becomes significantly worse. Pt states that she has used the maximum allotted amount of her migraine medications, pt states that she usually has some nausea with migraines, but that this feels different.

## 2015-04-15 NOTE — Discharge Instructions (Signed)
Recurrent Migraine Headache °A migraine headache is very bad, throbbing pain on one or both sides of your head. Recurrent migraines keep coming back. Talk to your doctor about what things may bring on (trigger) your migraine headaches. °HOME CARE °· Only take medicines as told by your doctor. °· Lie down in a dark, quiet room when you have a migraine. °· Keep a journal to find out if certain things bring on migraine headaches. For example, write down: °¨ What you eat and drink. °¨ How much sleep you get. °¨ Any change to your diet or medicines. °· Lessen how much alcohol you drink. °· Quit smoking if you smoke. °· Get enough sleep. °· Lessen any stress in your life. °· Keep lights dim if bright lights bother you or make your migraines worse. °GET HELP IF: °· Medicine does not help your migraines. °· Your pain keeps coming back. °· You have a fever. °GET HELP RIGHT AWAY IF:  °· Your migraine becomes really bad. °· You have a stiff neck. °· You have trouble seeing. °· Your muscles are weak, or you lose muscle control. °· You lose your balance or have trouble walking. °· You feel like you will pass out (faint), or you pass out. °· You have really bad symptoms that are different than your first symptoms. °MAKE SURE YOU:  °· Understand these instructions. °· Will watch your condition. °· Will get help right away if you are not doing well or get worse. °Document Released: 07/01/2008 Document Revised: 09/27/2013 Document Reviewed: 05/30/2013 °[employer]® Patient Information ©2015 [employer]. This information is not intended to replace advice given to you by your health care provider. Make sure you discuss any questions you have with your health care provider. ° °

## 2015-04-15 NOTE — ED Provider Notes (Signed)
CSN: [id number]     Arrival date & time 04/15/15  1244 History   First MD Initiated Contact with Patient 04/15/15 1524     Chief Complaint  Patient presents with  . Headache     (Consider location/radiation/quality/duration/timing/severity/associated sxs/prior Treatment) Patient is a 24 y.o. female presenting with headaches. The history is provided by the patient. No language interpreter was used.  Headache Pain location:  Frontal and occipital Quality:  Sharp Radiates to:  Does not radiate Pain severity now: servere. Pain scale at highest: severe. Onset quality:  Gradual Duration:  6 days Associated symptoms: fatigue, nausea, photophobia and vomiting   Associated symptoms: no abdominal pain, no back pain, no congestion, no cough, no diarrhea, no fever, no neck pain, no neck stiffness, no numbness, no sore throat and no weakness     Past Medical History  Diagnosis Date  . Left ureteral calculus   . Frequency of urination   . Urgency of urination   . Hematuria   . Nocturia   . Left flank pain    Past Surgical History  Procedure Laterality Date  . Extracorporeal shock wave lithotripsy  APR 2013    LEFT SIDE--  [facility]   . Wisdom tooth extraction  2012  . Cystoscopy w/ retrogrades  06/14/2012    Procedure: CYSTOSCOPY WITH RETROGRADE PYELOGRAM;  Surgeon: Garnett Farm, MD;  Location: [facility];  Service: Urology;  Laterality: Left;  . Cystoscopy with ureteroscopy  06/14/2012    Procedure: CYSTOSCOPY WITH URETEROSCOPY;  Surgeon: Garnett Farm, MD;  Location: [facility];  Service: Urology;  Laterality: Left;   History reviewed. No pertinent family history. History  Substance Use Topics  . Smoking status: Current Every Day Smoker -- 0.50 packs/day for .6 years    Types: Cigarettes  . Smokeless tobacco: Never Used  . Alcohol Use: Yes     Comment: OCCASIONAL   OB History    No data available     Review of Systems   Constitutional: Positive for fatigue. Negative for fever, chills, diaphoresis, activity change and appetite change.  HENT: Negative for congestion, facial swelling, rhinorrhea and sore throat.   Eyes: Positive for photophobia. Negative for discharge.  Respiratory: Negative for cough, chest tightness and shortness of breath.   Cardiovascular: Negative for chest pain, palpitations and leg swelling.  Gastrointestinal: Positive for nausea and vomiting. Negative for abdominal pain and diarrhea.  Endocrine: Negative for polydipsia and polyuria.  Genitourinary: Negative for dysuria, frequency, difficulty urinating and pelvic pain.  Musculoskeletal: Negative for back pain, arthralgias, neck pain and neck stiffness.  Skin: Negative for color change and wound.  Allergic/Immunologic: Negative for immunocompromised state.  Neurological: Positive for headaches. Negative for facial asymmetry, weakness and numbness.  Hematological: Does not bruise/bleed easily.  Psychiatric/Behavioral: Negative for confusion and agitation.      Allergies  Review of patient's allergies indicates no known allergies.  Home Medications   Prior to Admission medications   Medication Sig Start Date End Date Taking? Authorizing Provider  amitriptyline (ELAVIL) 10 MG tablet Take 2 tablets by mouth at bedtime. 04/14/15  Yes Historical Provider, MD  ibuprofen (ADVIL,MOTRIN) 200 MG tablet Take 600-800 mg by mouth every 6 (six) hours as needed for moderate pain.   Yes Historical Provider, MD  naproxen sodium (ANAPROX) 220 MG tablet Take 440 mg by mouth 2 (two) times daily as needed (headache).   Yes Historical Provider, MD  naratriptan (AMERGE) 2.5 MG tablet Take 1  tablet by mouth. For migraine. May repeat in 4 hours if needed. Max of 2 tabs in 24 hours and 2 to 3 days per week. 04/11/15  Yes Historical Provider, MD  Soft Lens Products (RA SALINE SOLUTION) SOLN Place 1-2 drops into both eyes daily as needed (irritation).   Yes  Historical Provider, MD   BP 110/49 mmHg  Pulse 64  Temp(Src) 97.9 F (36.6 C) (Oral)  Resp 16  SpO2 100% Physical Exam  Constitutional: She is oriented to person, place, and time. She appears well-developed and well-nourished. No distress.  HENT:  Head: Normocephalic and atraumatic.  Mouth/Throat: No oropharyngeal exudate.  Eyes: Pupils are equal, round, and reactive to light.  Neck: Normal range of motion. Neck supple.  Cardiovascular: Normal rate, regular rhythm and normal heart sounds.  Exam reveals no gallop and no friction rub.   No murmur heard. Pulmonary/Chest: Effort normal and breath sounds normal. No respiratory distress. She has no wheezes. She has no rales.  Abdominal: Soft. Bowel sounds are normal. She exhibits no distension and no mass. There is no tenderness. There is no rebound and no guarding.  Musculoskeletal: Normal range of motion. She exhibits no edema or tenderness.  Neurological: She is alert and oriented to person, place, and time. She has normal strength. She displays no atrophy and no tremor. No cranial nerve deficit or sensory deficit. She exhibits normal muscle tone. She displays a negative Romberg sign. She displays no seizure activity. Coordination and gait normal. GCS eye subscore is 4. GCS verbal subscore is 5. GCS motor subscore is 6.  Skin: Skin is warm and dry.  Psychiatric: She has a normal mood and affect.    ED Course  Procedures (including critical care time) Labs Review Labs Reviewed  POC URINE PREG, ED    Imaging Review Ct Head Wo Contrast  04/15/2015   CLINICAL DATA:  Migraine headache for 6 days.  EXAM: CT HEAD WITHOUT CONTRAST  TECHNIQUE: Contiguous axial images were obtained from the base of the skull through the vertex without intravenous contrast.  COMPARISON:  None.  FINDINGS: Bony calvarium appears intact. No mass effect or midline shift is noted. Ventricular size is within normal limits. There is no evidence of mass lesion,  hemorrhage or acute infarction.  IMPRESSION: Normal head CT.   Electronically Signed   By: Lupita RaiderJames  Green Jr, M.D.   On: 04/15/2015 17:12     EKG Interpretation None      MDM   Final diagnoses:  Migraine with status migrainosus, not intractable, unspecified migraine type    Pt is a 24 y.o. female with Pmhx as above who presents with 6 days of constant h/a, I was suspicious for nausea, vomiting, headache, photophobia and phonophobia.  Pain is worse when she leans forward On physical exam, vital signs are stable and patient is in acute distress.  Cardiopulmonary and neurologic exam is benign. Given pt reporting h/a much worse with position change, will get CT head, and will treat with migraine cocktail.   CT head normal.  Patient feeling much improved after migraine cocktail.  I feel she is safe to be discharged home.  She can follow-up with her headache specialist, Dr. Clarisse GougeLewit.   Pts  evaluation in the Emergency Department is complete. It has been determined that no acute conditions requiring further emergency intervention are present at this time. The patient/guardian have been advised of the diagnosis and plan. We have discussed signs and symptoms that warrant return to the ED, such  as changes or worsening in symptoms, worsening pain, fever, numbness, weakness, confusion.      Toy Cookey, MD 04/15/15 [phone]

## 2016-01-28 DIAGNOSIS — J029 Acute pharyngitis, unspecified: Secondary | ICD-10-CM | POA: Diagnosis not present

## 2016-02-08 DIAGNOSIS — M25552 Pain in left hip: Secondary | ICD-10-CM | POA: Diagnosis not present

## 2016-02-08 DIAGNOSIS — Z0000 Encounter for general adult medical examination without abnormal findings: Secondary | ICD-10-CM | POA: Diagnosis not present

## 2016-02-08 DIAGNOSIS — M545 Low back pain: Secondary | ICD-10-CM | POA: Diagnosis not present

## 2016-02-08 DIAGNOSIS — E663 Overweight: Secondary | ICD-10-CM | POA: Diagnosis not present

## 2016-02-08 DIAGNOSIS — J301 Allergic rhinitis due to pollen: Secondary | ICD-10-CM | POA: Diagnosis not present

## 2016-02-08 DIAGNOSIS — Z209 Contact with and (suspected) exposure to unspecified communicable disease: Secondary | ICD-10-CM | POA: Diagnosis not present

## 2016-02-08 DIAGNOSIS — G43009 Migraine without aura, not intractable, without status migrainosus: Secondary | ICD-10-CM | POA: Diagnosis not present

## 2016-02-12 DIAGNOSIS — M25552 Pain in left hip: Secondary | ICD-10-CM | POA: Diagnosis not present

## 2016-02-12 DIAGNOSIS — M25551 Pain in right hip: Secondary | ICD-10-CM | POA: Diagnosis not present

## 2016-02-12 DIAGNOSIS — M24852 Other specific joint derangements of left hip, not elsewhere classified: Secondary | ICD-10-CM | POA: Diagnosis not present

## 2016-02-12 DIAGNOSIS — M24851 Other specific joint derangements of right hip, not elsewhere classified: Secondary | ICD-10-CM | POA: Diagnosis not present

## 2016-02-20 ENCOUNTER — Other Ambulatory Visit (HOSPITAL_COMMUNITY)
Admission: RE | Admit: 2016-02-20 | Discharge: 2016-02-20 | Disposition: A | Discharge status: Home / Self Care | Payer: BLUE CROSS/BLUE SHIELD | Source: Ambulatory Visit | Attending: Family Medicine | Admitting: Family Medicine

## 2016-02-20 ENCOUNTER — Other Ambulatory Visit: Payer: Self-pay | Admitting: Family Medicine

## 2016-02-20 DIAGNOSIS — Z113 Encounter for screening for infections with a predominantly sexual mode of transmission: Secondary | ICD-10-CM | POA: Insufficient documentation

## 2016-02-20 DIAGNOSIS — Z01419 Encounter for gynecological examination (general) (routine) without abnormal findings: Secondary | ICD-10-CM | POA: Insufficient documentation

## 2016-02-20 DIAGNOSIS — Z79899 Other long term (current) drug therapy: Secondary | ICD-10-CM | POA: Diagnosis not present

## 2016-02-21 DIAGNOSIS — G8929 Other chronic pain: Secondary | ICD-10-CM | POA: Diagnosis not present

## 2016-02-21 DIAGNOSIS — M25552 Pain in left hip: Secondary | ICD-10-CM | POA: Diagnosis not present

## 2016-02-25 LAB — CYTOLOGY - PAP

## 2016-02-26 DIAGNOSIS — G44219 Episodic tension-type headache, not intractable: Secondary | ICD-10-CM | POA: Diagnosis not present

## 2016-02-26 DIAGNOSIS — G8929 Other chronic pain: Secondary | ICD-10-CM | POA: Diagnosis not present

## 2016-02-26 DIAGNOSIS — G43009 Migraine without aura, not intractable, without status migrainosus: Secondary | ICD-10-CM | POA: Diagnosis not present

## 2016-02-26 DIAGNOSIS — M25552 Pain in left hip: Secondary | ICD-10-CM | POA: Diagnosis not present

## 2016-02-28 DIAGNOSIS — G8929 Other chronic pain: Secondary | ICD-10-CM | POA: Diagnosis not present

## 2016-02-28 DIAGNOSIS — M25552 Pain in left hip: Secondary | ICD-10-CM | POA: Diagnosis not present

## 2016-03-05 DIAGNOSIS — M25552 Pain in left hip: Secondary | ICD-10-CM | POA: Diagnosis not present

## 2016-03-05 DIAGNOSIS — G8929 Other chronic pain: Secondary | ICD-10-CM | POA: Diagnosis not present

## 2016-03-10 DIAGNOSIS — G8929 Other chronic pain: Secondary | ICD-10-CM | POA: Diagnosis not present

## 2016-03-10 DIAGNOSIS — M25552 Pain in left hip: Secondary | ICD-10-CM | POA: Diagnosis not present

## 2016-03-12 DIAGNOSIS — G8929 Other chronic pain: Secondary | ICD-10-CM | POA: Diagnosis not present

## 2016-03-12 DIAGNOSIS — M25552 Pain in left hip: Secondary | ICD-10-CM | POA: Diagnosis not present

## 2016-03-13 DIAGNOSIS — M25552 Pain in left hip: Secondary | ICD-10-CM | POA: Diagnosis not present

## 2016-03-13 DIAGNOSIS — G8929 Other chronic pain: Secondary | ICD-10-CM | POA: Diagnosis not present

## 2016-03-13 DIAGNOSIS — M24852 Other specific joint derangements of left hip, not elsewhere classified: Secondary | ICD-10-CM | POA: Diagnosis not present

## 2016-03-13 DIAGNOSIS — M24851 Other specific joint derangements of right hip, not elsewhere classified: Secondary | ICD-10-CM | POA: Diagnosis not present

## 2016-03-20 DIAGNOSIS — G8929 Other chronic pain: Secondary | ICD-10-CM | POA: Diagnosis not present

## 2016-03-20 DIAGNOSIS — M25552 Pain in left hip: Secondary | ICD-10-CM | POA: Diagnosis not present

## 2016-03-25 DIAGNOSIS — G8929 Other chronic pain: Secondary | ICD-10-CM | POA: Diagnosis not present

## 2016-03-25 DIAGNOSIS — M25552 Pain in left hip: Secondary | ICD-10-CM | POA: Diagnosis not present

## 2016-04-03 DIAGNOSIS — G8929 Other chronic pain: Secondary | ICD-10-CM | POA: Diagnosis not present

## 2016-04-03 DIAGNOSIS — M25552 Pain in left hip: Secondary | ICD-10-CM | POA: Diagnosis not present

## 2016-04-10 DIAGNOSIS — G8929 Other chronic pain: Secondary | ICD-10-CM | POA: Diagnosis not present

## 2016-04-10 DIAGNOSIS — M25552 Pain in left hip: Secondary | ICD-10-CM | POA: Diagnosis not present

## 2016-04-22 DIAGNOSIS — G8929 Other chronic pain: Secondary | ICD-10-CM | POA: Diagnosis not present

## 2016-04-22 DIAGNOSIS — M25552 Pain in left hip: Secondary | ICD-10-CM | POA: Diagnosis not present

## 2016-04-24 DIAGNOSIS — G8929 Other chronic pain: Secondary | ICD-10-CM | POA: Diagnosis not present

## 2016-04-24 DIAGNOSIS — M25552 Pain in left hip: Secondary | ICD-10-CM | POA: Diagnosis not present

## 2016-05-27 DIAGNOSIS — G44219 Episodic tension-type headache, not intractable: Secondary | ICD-10-CM | POA: Diagnosis not present

## 2016-05-27 DIAGNOSIS — G43009 Migraine without aura, not intractable, without status migrainosus: Secondary | ICD-10-CM | POA: Diagnosis not present

## 2016-09-16 DIAGNOSIS — G44219 Episodic tension-type headache, not intractable: Secondary | ICD-10-CM | POA: Diagnosis not present

## 2016-09-16 DIAGNOSIS — R55 Syncope and collapse: Secondary | ICD-10-CM | POA: Diagnosis not present

## 2016-09-16 DIAGNOSIS — G43009 Migraine without aura, not intractable, without status migrainosus: Secondary | ICD-10-CM | POA: Diagnosis not present

## 2016-09-24 DIAGNOSIS — J358 Other chronic diseases of tonsils and adenoids: Secondary | ICD-10-CM | POA: Diagnosis not present

## 2016-10-01 DIAGNOSIS — J370 Chronic laryngitis: Secondary | ICD-10-CM | POA: Diagnosis not present

## 2016-10-01 DIAGNOSIS — J322 Chronic ethmoidal sinusitis: Secondary | ICD-10-CM | POA: Diagnosis not present

## 2016-10-01 DIAGNOSIS — J039 Acute tonsillitis, unspecified: Secondary | ICD-10-CM | POA: Diagnosis not present

## 2016-10-01 DIAGNOSIS — J320 Chronic maxillary sinusitis: Secondary | ICD-10-CM | POA: Diagnosis not present

## 2016-10-06 HISTORY — PX: TONSILLECTOMY: SUR1361

## 2016-10-20 DIAGNOSIS — J3501 Chronic tonsillitis: Secondary | ICD-10-CM | POA: Diagnosis not present

## 2016-10-20 DIAGNOSIS — Z6826 Body mass index (BMI) 26.0-26.9, adult: Secondary | ICD-10-CM | POA: Diagnosis not present

## 2016-10-20 DIAGNOSIS — J358 Other chronic diseases of tonsils and adenoids: Secondary | ICD-10-CM | POA: Diagnosis not present

## 2016-11-07 DIAGNOSIS — J358 Other chronic diseases of tonsils and adenoids: Secondary | ICD-10-CM | POA: Diagnosis not present

## 2016-11-07 DIAGNOSIS — J3501 Chronic tonsillitis: Secondary | ICD-10-CM | POA: Diagnosis not present

## 2016-11-07 DIAGNOSIS — Z87891 Personal history of nicotine dependence: Secondary | ICD-10-CM | POA: Diagnosis not present

## 2016-12-16 DIAGNOSIS — G44219 Episodic tension-type headache, not intractable: Secondary | ICD-10-CM | POA: Diagnosis not present

## 2016-12-16 DIAGNOSIS — G43009 Migraine without aura, not intractable, without status migrainosus: Secondary | ICD-10-CM | POA: Diagnosis not present

## 2017-01-15 DIAGNOSIS — R1032 Left lower quadrant pain: Secondary | ICD-10-CM | POA: Diagnosis not present

## 2017-01-15 DIAGNOSIS — N200 Calculus of kidney: Secondary | ICD-10-CM | POA: Diagnosis not present

## 2017-01-15 DIAGNOSIS — R300 Dysuria: Secondary | ICD-10-CM | POA: Diagnosis not present

## 2017-01-16 DIAGNOSIS — K5780 Diverticulitis of intestine, part unspecified, with perforation and abscess without bleeding: Secondary | ICD-10-CM | POA: Diagnosis not present

## 2017-01-16 DIAGNOSIS — R109 Unspecified abdominal pain: Secondary | ICD-10-CM | POA: Diagnosis not present

## 2017-01-16 DIAGNOSIS — R1032 Left lower quadrant pain: Secondary | ICD-10-CM | POA: Diagnosis not present

## 2017-01-16 DIAGNOSIS — N200 Calculus of kidney: Secondary | ICD-10-CM | POA: Diagnosis not present

## 2017-03-05 DIAGNOSIS — Z13220 Encounter for screening for lipoid disorders: Secondary | ICD-10-CM | POA: Diagnosis not present

## 2017-03-05 DIAGNOSIS — Z0000 Encounter for general adult medical examination without abnormal findings: Secondary | ICD-10-CM | POA: Diagnosis not present

## 2017-03-17 DIAGNOSIS — G44219 Episodic tension-type headache, not intractable: Secondary | ICD-10-CM | POA: Diagnosis not present

## 2017-03-17 DIAGNOSIS — G43009 Migraine without aura, not intractable, without status migrainosus: Secondary | ICD-10-CM | POA: Diagnosis not present

## 2017-04-05 DIAGNOSIS — H04541 Stenosis of right lacrimal canaliculi: Secondary | ICD-10-CM | POA: Diagnosis not present

## 2017-04-05 DIAGNOSIS — H109 Unspecified conjunctivitis: Secondary | ICD-10-CM | POA: Diagnosis not present

## 2017-04-15 DIAGNOSIS — K5732 Diverticulitis of large intestine without perforation or abscess without bleeding: Secondary | ICD-10-CM | POA: Diagnosis not present

## 2017-05-07 DIAGNOSIS — K5732 Diverticulitis of large intestine without perforation or abscess without bleeding: Secondary | ICD-10-CM | POA: Diagnosis not present

## 2017-05-07 DIAGNOSIS — K5730 Diverticulosis of large intestine without perforation or abscess without bleeding: Secondary | ICD-10-CM | POA: Diagnosis not present

## 2017-05-07 DIAGNOSIS — R933 Abnormal findings on diagnostic imaging of other parts of digestive tract: Secondary | ICD-10-CM | POA: Diagnosis not present

## 2017-07-05 IMAGING — CT CT HEAD W/O CM
2 series · 16 of 30 positions shown, 20 images · non-contrast
Comparison: None.

CLINICAL DATA: Migraine headache for 6 days.

EXAM:
CT HEAD WITHOUT CONTRAST
TECHNIQUE: Contiguous axial images were obtained from the base of the skull
through the vertex without intravenous contrast.

[Series 2: head w/o · axial · non-contrast · 0.42mm/px · z∈[-158,-32]mm · 13 of 31 slices shown, 17 images]
[im 3/31  brain]
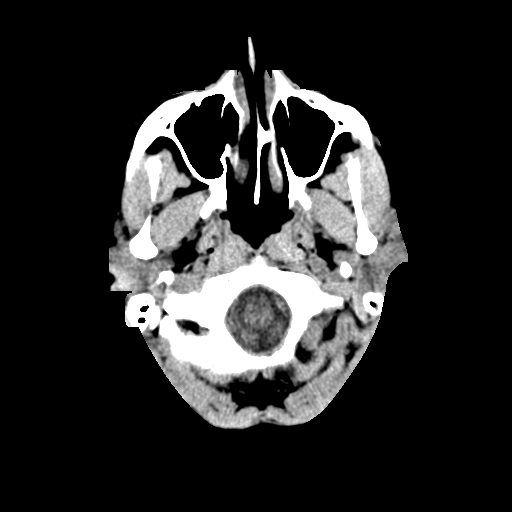
[im 3/31  bone]
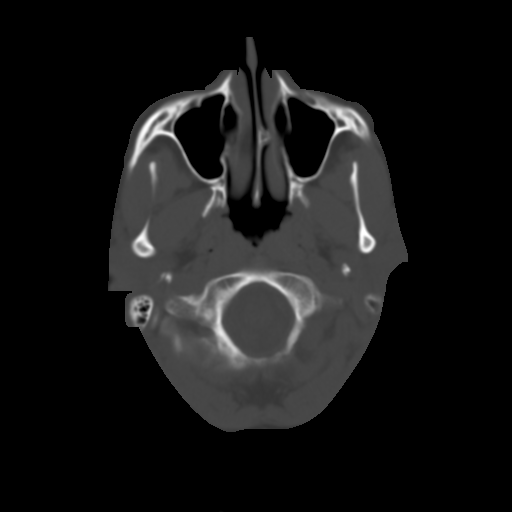
[im 5/31  brain]
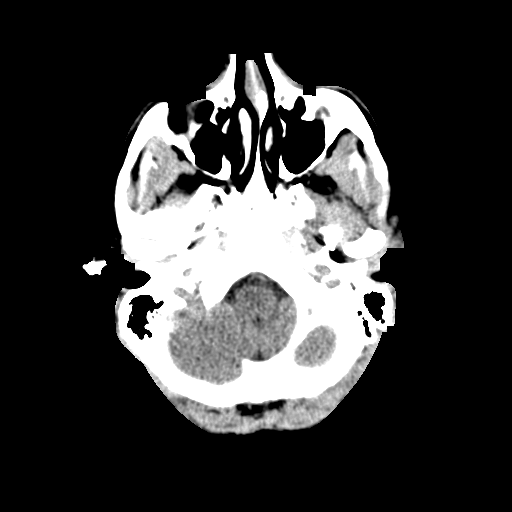
[im 7/31  brain]
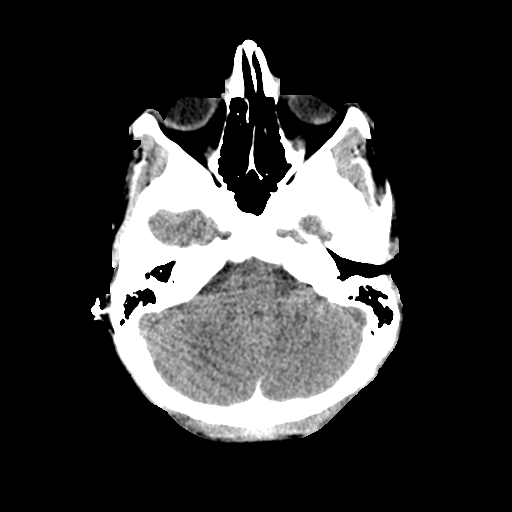
[im 9/31  brain]
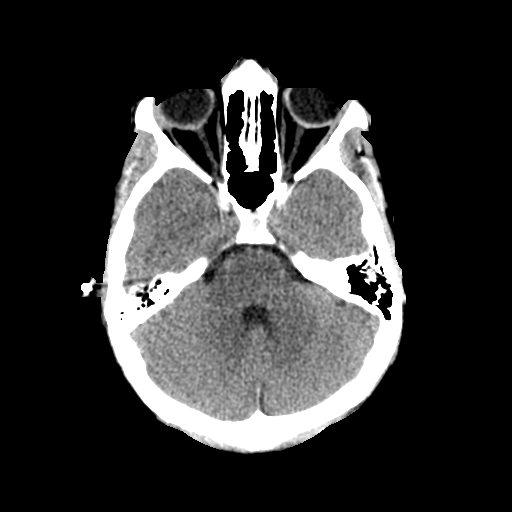
[im 11/31  brain]
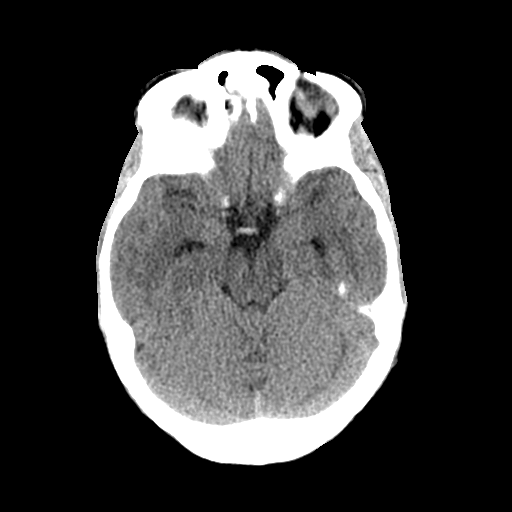
[im 11/31  bone]
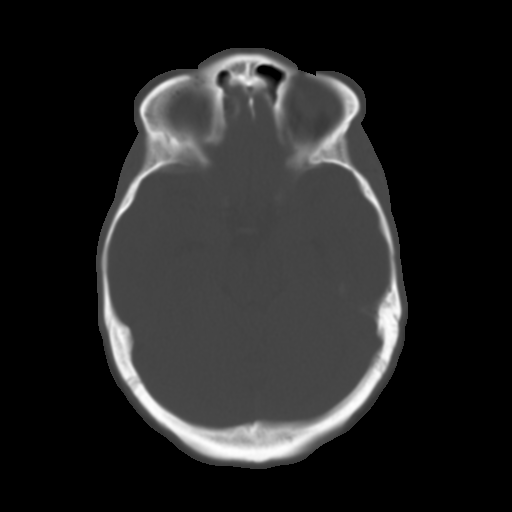
[im 13/31  brain]
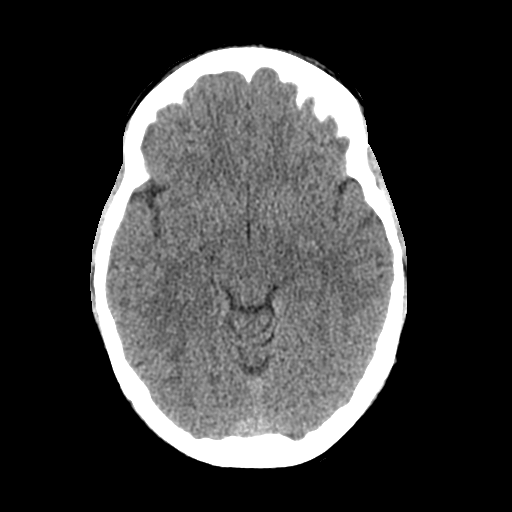
[im 16/31  brain]
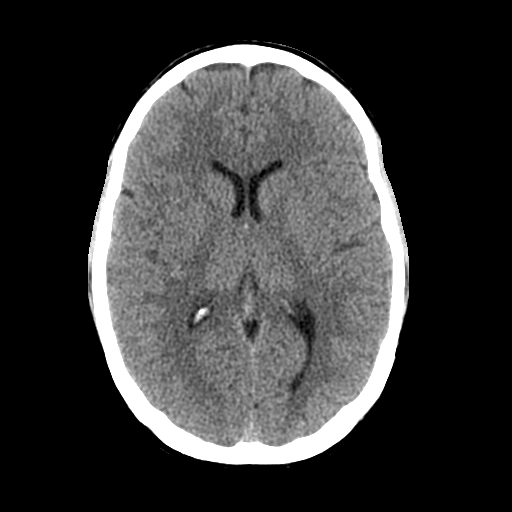
[im 18/31  brain]
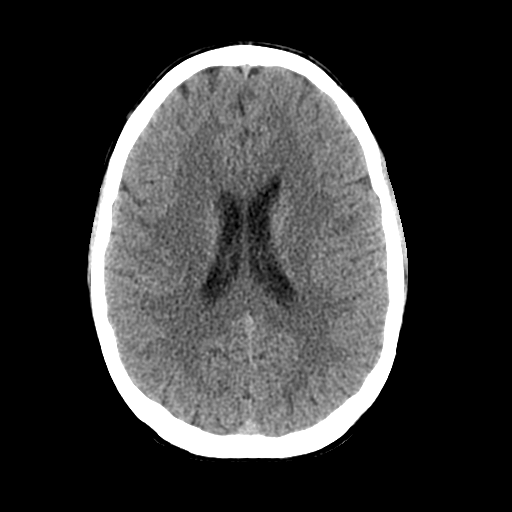
[im 20/31  brain]
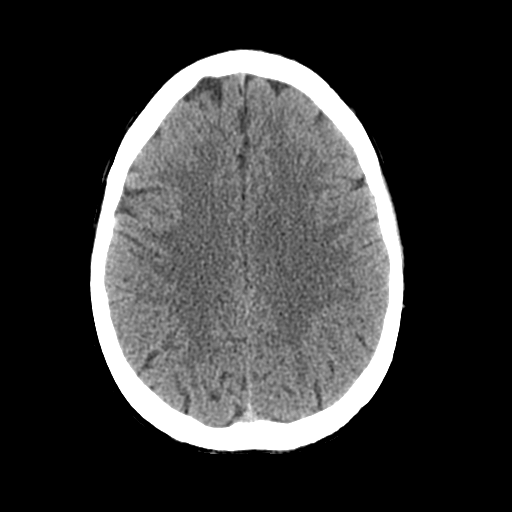
[im 20/31  bone]
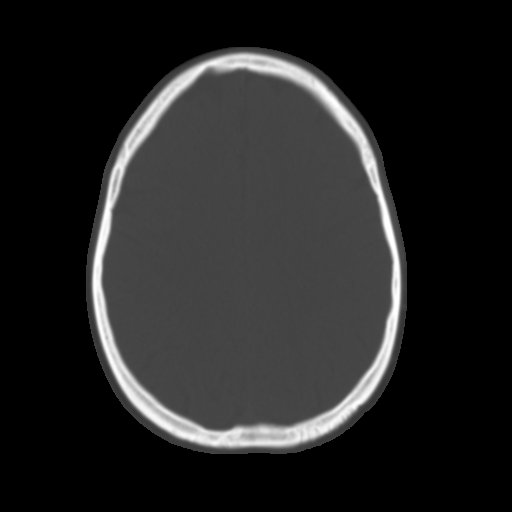
[im 22/31  brain]
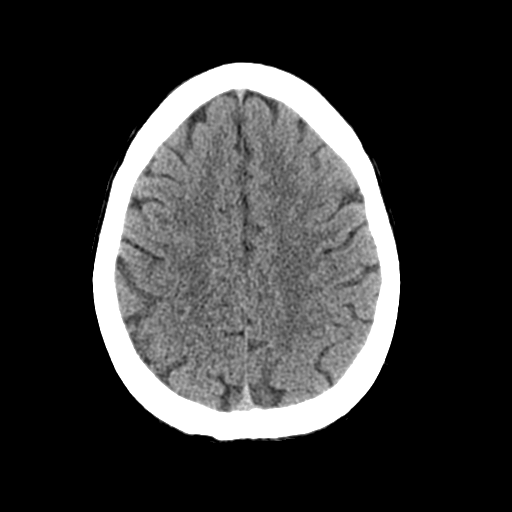
[im 24/31  brain]
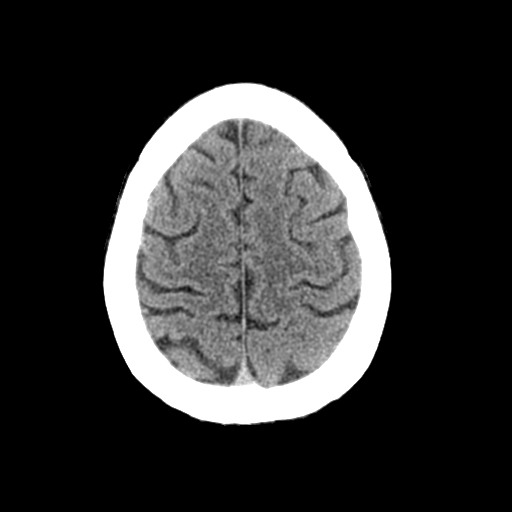
[im 26/31  brain]
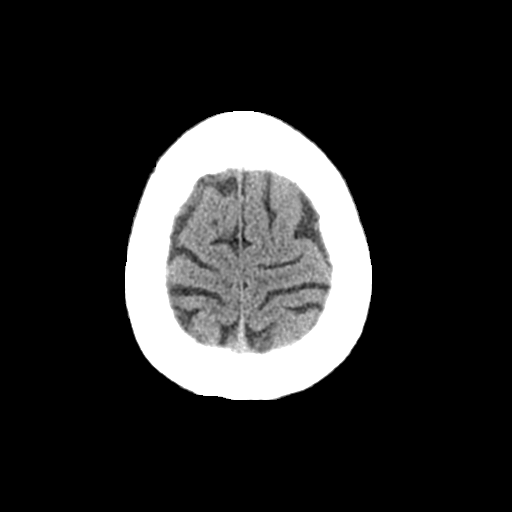
[im 28/31  brain]
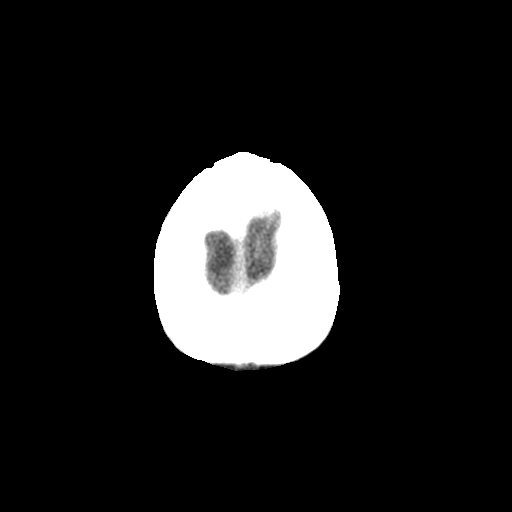
[im 28/31  bone]
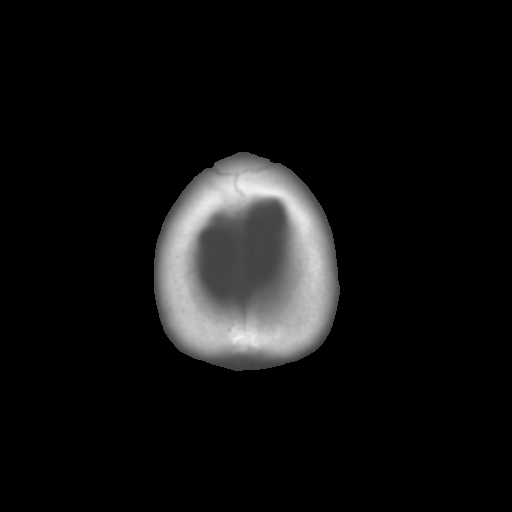

[Series 3: bone windows · axial · 0.42mm/px · z∈[-158,-118]mm · 3 of 31 slices shown]
[im 3/31  bone]
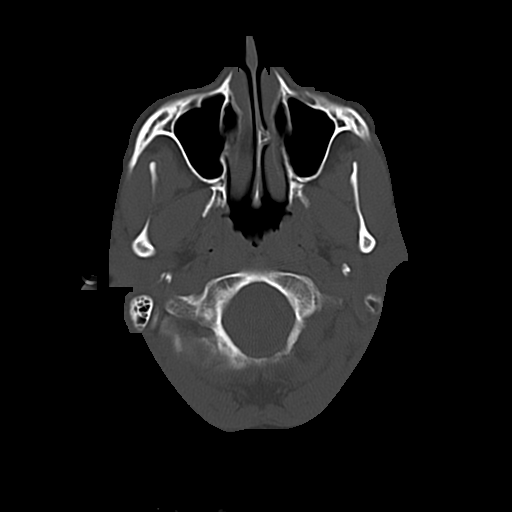
[im 7/31  bone]
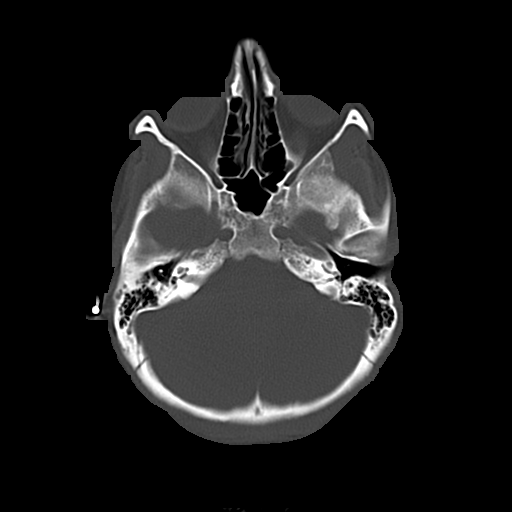
[im 11/31  bone]
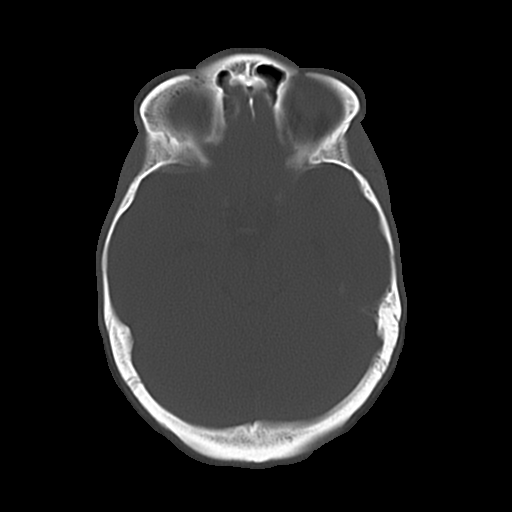

[16 of 30 positions shown; findings below may reference images not displayed]

FINDINGS: Bony calvarium appears intact. No mass effect or midline shift is
noted. Ventricular size is within normal limits. There is no
evidence of mass lesion, hemorrhage or acute infarction.
IMPRESSION: Normal head CT.

## 2018-03-26 ENCOUNTER — Encounter (HOSPITAL_COMMUNITY): Payer: Self-pay | Admitting: *Deleted

## 2018-03-26 ENCOUNTER — Emergency Department (HOSPITAL_COMMUNITY)
Admission: EM | Admit: 2018-03-26 | Discharge: 2018-03-26 | Disposition: A | Discharge status: Home / Self Care | Payer: BLUE CROSS/BLUE SHIELD | Attending: Emergency Medicine | Admitting: Emergency Medicine

## 2018-03-26 ENCOUNTER — Emergency Department (HOSPITAL_COMMUNITY): Payer: BLUE CROSS/BLUE SHIELD

## 2018-03-26 DIAGNOSIS — Z8719 Personal history of other diseases of the digestive system: Secondary | ICD-10-CM | POA: Diagnosis not present

## 2018-03-26 DIAGNOSIS — K5790 Diverticulosis of intestine, part unspecified, without perforation or abscess without bleeding: Secondary | ICD-10-CM | POA: Diagnosis not present

## 2018-03-26 DIAGNOSIS — K5732 Diverticulitis of large intestine without perforation or abscess without bleeding: Secondary | ICD-10-CM | POA: Insufficient documentation

## 2018-03-26 DIAGNOSIS — K5792 Diverticulitis of intestine, part unspecified, without perforation or abscess without bleeding: Secondary | ICD-10-CM

## 2018-03-26 DIAGNOSIS — R109 Unspecified abdominal pain: Secondary | ICD-10-CM | POA: Diagnosis not present

## 2018-03-26 DIAGNOSIS — Z87442 Personal history of urinary calculi: Secondary | ICD-10-CM | POA: Diagnosis not present

## 2018-03-26 DIAGNOSIS — R1032 Left lower quadrant pain: Secondary | ICD-10-CM | POA: Diagnosis not present

## 2018-03-26 DIAGNOSIS — Z87891 Personal history of nicotine dependence: Secondary | ICD-10-CM | POA: Insufficient documentation

## 2018-03-26 LAB — COMPREHENSIVE METABOLIC PANEL
ALT: 14 U/L (ref 14–54)
AST: 17 U/L (ref 15–41)
Albumin: 4.2 g/dL (ref 3.5–5.0)
Alkaline Phosphatase: 42 U/L (ref 38–126)
Anion gap: 6 (ref 5–15)
BUN: 11 mg/dL (ref 6–20)
CO2: 26 mmol/L (ref 22–32)
Calcium: 8.8 mg/dL — ABNORMAL LOW (ref 8.9–10.3)
Chloride: 109 mmol/L (ref 101–111)
Creatinine, Ser: 0.77 mg/dL (ref 0.44–1.00)
GFR calc Af Amer: >60 mL/min (ref >60)
GFR calc non Af Amer: >60 mL/min (ref >60)
Glucose, Bld: 89 mg/dL (ref 65–99)
Potassium: 4.3 mmol/L (ref 3.5–5.1)
Sodium: 141 mmol/L (ref 135–145)
Total Bilirubin: 0.6 mg/dL (ref 0.3–1.2)
Total Protein: 7.1 g/dL (ref 6.5–8.1)

## 2018-03-26 LAB — CBC
HCT: 42.8 % (ref 36.0–46.0)
Hemoglobin: 13.9 g/dL (ref 12.0–15.0)
MCH: 29.4 pg (ref 26.0–34.0)
MCHC: 32.5 g/dL (ref 30.0–36.0)
MCV: 90.5 fL (ref 78.0–100.0)
Platelets: 197 10*3/uL (ref 150–400)
RBC: 4.73 MIL/uL (ref 3.87–5.11)
RDW: 12.7 % (ref 11.5–15.5)
WBC: 8.7 10*3/uL (ref 4.0–10.5)

## 2018-03-26 LAB — LIPASE, BLOOD: Lipase: 28 U/L (ref 11–51)

## 2018-03-26 LAB — URINALYSIS, ROUTINE W REFLEX MICROSCOPIC
Bilirubin Urine: NEGATIVE
Glucose, UA: NEGATIVE mg/dL
Hgb urine dipstick: NEGATIVE
Ketones, ur: NEGATIVE mg/dL
Leukocytes, UA: NEGATIVE
Nitrite: NEGATIVE
Protein, ur: NEGATIVE mg/dL
Specific Gravity, Urine: 1.009 (ref 1.005–1.030)
pH: 8 (ref 5.0–8.0)

## 2018-03-26 LAB — I-STAT BETA HCG BLOOD, ED (MC, WL, AP ONLY): I-stat hCG, quantitative: <5 m[IU]/mL (ref <5)

## 2018-03-26 MED ORDER — CIPROFLOXACIN IN D5W 400 MG/200ML IV SOLN
400.0000 mg | Freq: Once | INTRAVENOUS | Status: AC
  Administered 2018-03-26: 400 mg via INTRAVENOUS
  Filled 2018-03-26: qty 200

## 2018-03-26 MED ORDER — METRONIDAZOLE 500 MG PO TABS: 500.0000 mg | ORAL_TABLET | Freq: Two times a day (BID) | ORAL | 0 refills | Status: DC

## 2018-03-26 MED ORDER — SODIUM CHLORIDE 0.9 % IV BOLUS
1000.0000 mL | Freq: Once | INTRAVENOUS | Status: AC
  Administered 2018-03-26: 1000 mL via INTRAVENOUS

## 2018-03-26 MED ORDER — PROMETHAZINE HCL 25 MG PO TABS: 25.0000 mg | ORAL_TABLET | Freq: Four times a day (QID) | ORAL | 0 refills | Status: DC | PRN

## 2018-03-26 MED ORDER — IOPAMIDOL (ISOVUE-300) INJECTION 61%
100.0000 mL | Freq: Once | INTRAVENOUS | Status: AC | PRN
  Administered 2018-03-26: 100 mL via INTRAVENOUS

## 2018-03-26 MED ORDER — CIPROFLOXACIN HCL 500 MG PO TABS: 500.0000 mg | ORAL_TABLET | Freq: Two times a day (BID) | ORAL | 0 refills | Status: DC

## 2018-03-26 MED ORDER — HYDROCODONE-ACETAMINOPHEN 5-325 MG PO TABS: 1.0000 | ORAL_TABLET | Freq: Four times a day (QID) | ORAL | 0 refills | Status: DC | PRN

## 2018-03-26 MED ORDER — IOPAMIDOL (ISOVUE-300) INJECTION 61%
INTRAVENOUS | Status: AC
  Filled 2018-03-26: qty 100

## 2018-03-26 NOTE — ED Notes (Signed)
Discharge instructions reviewed with pt. Pt verbalized understanding. Pt to follow up with PCP. PIV removed. PT ambulatory to waiting room.  

## 2018-03-26 NOTE — Discharge Instructions (Addendum)
Return here as needed.  Follow-up with your GI doctor °

## 2018-03-26 NOTE — ED Triage Notes (Signed)
Pt was sent by her pcp for left sided abdominal pain and nausea. Pt sts pcp wants her to have a CT done

## 2018-03-28 NOTE — ED Provider Notes (Signed)
[facility]-EMERGENCY DEPT Provider Note   CSN: [id number] Arrival date & time: 03/26/18  1305     History   Chief Complaint Chief Complaint  Patient presents with  . Abdominal Pain    HPI Alexandra Cortez is a 27 y.o. female.  HPI Patient presents to the emergency department with left lower quadrant abdominal pain that started yesterday.  The patient states that she was seen by her primary doctor who sent her here for a CT scan of her abdomen.  The patient has had a history of diverticulosis noted on colonoscopy.  Patient states that nothing seems to make the condition better but palpation makes the pain worse.  Patient states she did not take any other medications prior to arrival for symptoms.  The patient denies chest pain, shortness of breath, headache,blurred vision, neck pain, fever, cough, weakness, numbness, dizziness, anorexia, edema,  nausea, vomiting, diarrhea, rash, back pain, dysuria, hematemesis, bloody stool, near syncope, or syncope. Past Medical History:  Diagnosis Date  . Frequency of urination   . Hematuria   . Left flank pain   . Left ureteral calculus   . Nocturia   . Urgency of urination     Patient Active Problem List   Diagnosis Date Noted  . Ureteral calculus, left 06/14/2012    Past Surgical History:  Procedure Laterality Date  . CYSTOSCOPY W/ RETROGRADES  06/14/2012   Procedure: CYSTOSCOPY WITH RETROGRADE PYELOGRAM;  Surgeon: Garnett Farm, MD;  Location: [facility];  Service: Urology;  Laterality: Left;  . CYSTOSCOPY WITH URETEROSCOPY  06/14/2012   Procedure: CYSTOSCOPY WITH URETEROSCOPY;  Surgeon: Garnett Farm, MD;  Location: [facility];  Service: Urology;  Laterality: Left;  . EXTRACORPOREAL SHOCK WAVE LITHOTRIPSY  APR 2013   LEFT SIDE--  [facility]   . WISDOM TOOTH EXTRACTION  2012     OB History   None      Home Medications    Prior to Admission medications     Medication Sig Start Date End Date Taking? Authorizing Provider  fluticasone (FLONASE) 50 MCG/ACT nasal spray Place 1 spray into both nostrils daily as needed for allergies or rhinitis.   Yes [provider]  ibuprofen (ADVIL,MOTRIN) 200 MG tablet Take 600-800 mg by mouth every 6 (six) hours as needed for moderate pain.   Yes [provider]  ciprofloxacin (CIPRO) 500 MG tablet Take 1 tablet (500 mg total) by mouth 2 (two) times daily. 03/26/18   Ivonne Freeburg, Cristal Deer, PA-C  HYDROcodone-acetaminophen (NORCO/VICODIN) 5-325 MG tablet Take 1 tablet by mouth every 6 (six) hours as needed for moderate pain. 03/26/18   Santiago Graf, Cristal Deer, PA-C  metroNIDAZOLE (FLAGYL) 500 MG tablet Take 1 tablet (500 mg total) by mouth 2 (two) times daily. 03/26/18   Zubayr Bednarczyk, Cristal Deer, PA-C  promethazine (PHENERGAN) 25 MG tablet Take 1 tablet (25 mg total) by mouth every 6 (six) hours as needed for nausea or vomiting. 03/26/18   Iran Rowe, Cristal Deer, PA-C    Family History No family history on file.  Social History Social History   Tobacco Use  . Smoking status: Former Smoker    Packs/day: 0.50    Years: 0.60    Pack years: 0.30    Types: Cigarettes    Last attempt to quit: 10/06/2012    Years since quitting: 5.4  . Smokeless tobacco: Never Used  Substance Use Topics  . Alcohol use: Yes    Comment: OCCASIONAL  . Drug use: Yes  Types: Marijuana    Comment: OCCASIONAL MARIJUANA-- LAST USED 2 WKS AGO     Allergies   Patient has no known allergies.   Review of Systems Review of Systems All other systems negative except as documented in the HPI. All pertinent positives and negatives as reviewed in the HPI.  Physical Exam Updated Vital Signs BP 98/63 (BP Location: Right Arm)   Pulse 71   Temp 98.1 F (36.7 C) (Oral)   Resp 18   Ht 5\' 1"  (1.549 m)   Wt 61.2 kg (135 lb)   LMP 03/05/2018 (Approximate)   SpO2 100%   BMI 25.51 kg/m   Physical Exam  Constitutional: She is oriented  to person, place, and time. She appears well-developed and well-nourished. No distress.  HENT:  Head: Normocephalic and atraumatic.  Mouth/Throat: Oropharynx is clear and moist.  Eyes: Pupils are equal, round, and reactive to light.  Neck: Normal range of motion. Neck supple.  Cardiovascular: Normal rate, regular rhythm and normal heart sounds. Exam reveals no gallop and no friction rub.  No murmur heard. Pulmonary/Chest: Effort normal and breath sounds normal. No respiratory distress. She has no wheezes.  Abdominal: Soft. Bowel sounds are normal. She exhibits no distension. There is no hepatosplenomegaly. There is tenderness in the left lower quadrant. There is no rigidity and no guarding. No hernia.  Neurological: She is alert and oriented to person, place, and time. She exhibits normal muscle tone. Coordination normal.  Skin: Skin is warm and dry. Capillary refill takes less than 2 seconds. No rash noted. No erythema.  Psychiatric: She has a normal mood and affect. Her behavior is normal.  Nursing note and vitals reviewed.    ED Treatments / Results  Labs (all labs ordered are listed, but only abnormal results are displayed) Labs Reviewed  COMPREHENSIVE METABOLIC PANEL - Abnormal; Notable for the following components:      Result Value   Calcium 8.8 (*)    All other components within normal limits  URINALYSIS, ROUTINE W REFLEX MICROSCOPIC - Abnormal; Notable for the following components:   Color, Urine STRAW (*)    All other components within normal limits  LIPASE, BLOOD  CBC  I-STAT BETA HCG BLOOD, ED ([facility], [facility], [facility] ONLY)    EKG None  Radiology Ct Abdomen Pelvis W Contrast  Result Date: 03/26/2018 CLINICAL DATA:  Left-sided abdominal pain and nausea for 2 days. EXAM: CT ABDOMEN AND PELVIS WITH CONTRAST TECHNIQUE: Multidetector CT imaging of the abdomen and pelvis was performed using the standard protocol following bolus administration of intravenous contrast. CONTRAST:   ISOVUE-300 IOPAMIDOL (ISOVUE-300) INJECTION 61% COMPARISON:  01/16/2017 FINDINGS: Lower chest: Clear lung bases. Hepatobiliary: Small region of non-masslike hyperenhancement adjacent to the falciform ligament in segment IVB which may reflect a perfusion anomaly. Unremarkable gallbladder. No biliary dilatation. Pancreas: Unremarkable. Spleen: Unremarkable. Adrenals/Urinary Tract: Unremarkable adrenal glands. No evidence of renal mass, calculi, or hydronephrosis. Unremarkable bladder. Stomach/Bowel: The stomach is within normal limits. There is no evidence of bowel obstruction. Colonic diverticulosis is again noted with wall thickening and surrounding inflammatory stranding involving a portion of the distal descending colon corresponding to the same region that was inflamed on the prior CT. No extraluminal gas or fluid collection is evident. The appendix is unremarkable. Vascular/Lymphatic: No significant vascular findings are present. No enlarged abdominal or pelvic lymph nodes. Reproductive: Grossly unremarkable uterus and right ovary. 2 cm peripherally enhancing, crenulated structure associated with the left ovary likely represents a corpus luteum. Other: Small volume  pelvic free fluid.  No abdominal wall hernia. Musculoskeletal: No acute osseous abnormality or suspicious osseous lesion. Slight lumbar levoscoliosis. IMPRESSION: Acute diverticulitis of the distal descending colon without evidence of complication. Electronically Signed   By: Sebastian AcheAllen  Grady M.D.   On: 03/26/2018 16:43    Procedures Procedures (including critical care time)  Medications Ordered in ED Medications  iopamidol (ISOVUE-300) 61 % injection 100 mL (100 mLs Intravenous Contrast Given 03/26/18 1611)  sodium chloride 0.9 % bolus 1,000 mL (0 mLs Intravenous Stopped 03/26/18 1804)  ciprofloxacin (CIPRO) IVPB 400 mg (0 mg Intravenous Stopped 03/26/18 1927)     Initial Impression / Assessment and Plan / ED Course  I have reviewed the  triage vital signs and the nursing notes.  Pertinent labs & imaging results that were available during my care of the patient were reviewed by me and considered in my medical decision making (see chart for details).     Patient be treated with Cipro and Flagyl for diverticulitis is uncomplicated based on CT scan findings.  The patient is advised to follow-up with her GI doctor.  Told to return here as needed.  Patient agrees to the plan and all questions were answered.  I did give her return precautions for increasing pain despite medications nausea vomiting not controlled by medication and fever or any other worsening in her condition. Final Clinical Impressions(s) / ED Diagnoses   Final diagnoses:  None    ED Discharge Orders        Ordered    ciprofloxacin (CIPRO) 500 MG tablet  2 times daily     03/26/18 1939    metroNIDAZOLE (FLAGYL) 500 MG tablet  2 times daily     03/26/18 1939    HYDROcodone-acetaminophen (NORCO/VICODIN) 5-325 MG tablet  Every 6 hours PRN     03/26/18 1939    promethazine (PHENERGAN) 25 MG tablet  Every 6 hours PRN     03/26/18 1939       Charlestine NightLawyer, Vardaan Depascale, PA-C 03/28/18 0054    Tilden Fossaees, Elizabeth, MD 03/28/18 1447

## 2018-04-12 DIAGNOSIS — R197 Diarrhea, unspecified: Secondary | ICD-10-CM | POA: Diagnosis not present

## 2018-04-12 DIAGNOSIS — K5900 Constipation, unspecified: Secondary | ICD-10-CM | POA: Diagnosis not present

## 2018-05-20 DIAGNOSIS — A0472 Enterocolitis due to Clostridium difficile, not specified as recurrent: Secondary | ICD-10-CM | POA: Diagnosis not present

## 2018-10-28 DIAGNOSIS — J029 Acute pharyngitis, unspecified: Secondary | ICD-10-CM | POA: Diagnosis not present

## 2018-11-01 DIAGNOSIS — J209 Acute bronchitis, unspecified: Secondary | ICD-10-CM | POA: Diagnosis not present

## 2018-11-01 DIAGNOSIS — H109 Unspecified conjunctivitis: Secondary | ICD-10-CM | POA: Diagnosis not present

## 2018-12-09 DIAGNOSIS — A0472 Enterocolitis due to Clostridium difficile, not specified as recurrent: Secondary | ICD-10-CM | POA: Insufficient documentation

## 2018-12-09 DIAGNOSIS — M25562 Pain in left knee: Secondary | ICD-10-CM | POA: Diagnosis not present

## 2018-12-09 DIAGNOSIS — M222X2 Patellofemoral disorders, left knee: Secondary | ICD-10-CM | POA: Diagnosis not present

## 2018-12-09 DIAGNOSIS — N200 Calculus of kidney: Secondary | ICD-10-CM | POA: Insufficient documentation

## 2018-12-09 DIAGNOSIS — K5730 Diverticulosis of large intestine without perforation or abscess without bleeding: Secondary | ICD-10-CM | POA: Insufficient documentation

## 2019-05-31 ENCOUNTER — Telehealth: Payer: Self-pay | Admitting: Internal Medicine

## 2019-05-31 NOTE — Telephone Encounter (Signed)
Dr. Tarri Glenn, pt requested to transfer care from Dr. [name] to [facility] because she stated that "it is hard to get an appointment."  Pt would like to be seen for diverticulosis.  Records from [facility] will be sent to you for review.  Please advise scheduling.

## 2019-07-06 ENCOUNTER — Encounter: Payer: Self-pay | Admitting: Gastroenterology

## 2019-07-06 ENCOUNTER — Encounter (INDEPENDENT_AMBULATORY_CARE_PROVIDER_SITE_OTHER): Payer: Self-pay

## 2019-07-06 ENCOUNTER — Ambulatory Visit: Payer: BC Managed Care – PPO | Admitting: Gastroenterology

## 2019-07-06 VITALS — BP 112/74 | Temp 97.6°F | Ht 61.0 in | Wt 150.0 lb

## 2019-07-06 DIAGNOSIS — K5732 Diverticulitis of large intestine without perforation or abscess without bleeding: Secondary | ICD-10-CM

## 2019-07-06 DIAGNOSIS — K5900 Constipation, unspecified: Secondary | ICD-10-CM

## 2019-07-06 DIAGNOSIS — R140 Abdominal distension (gaseous): Secondary | ICD-10-CM | POA: Diagnosis not present

## 2019-07-06 MED ORDER — LINACLOTIDE 145 MCG PO CAPS: 145.0000 ug | ORAL_CAPSULE | Freq: Every day | ORAL | 3 refills | Status: DC

## 2019-07-06 MED ORDER — LUBIPROSTONE 8 MCG PO CAPS: 8.0000 ug | ORAL_CAPSULE | Freq: Two times a day (BID) | ORAL | 3 refills | Status: DC

## 2019-07-06 NOTE — Progress Notes (Signed)
Referring Provider: Gavin Pound, MD Primary Care Physician:  Leighton Ruff, MD  Reason for Consultation:  Diverticulosis, diverticulitis   IMPRESSION:  Recurrent, acute diverticulitis Constipation or bloating Weekly ibuprofen for headaches Family history of colon polyps (mother)  No ongoing symptoms related to diverticulitis, but, multiple acute, recurrent episodes including some symptoms last month. Must consider SCAD and post-diverticulitis IBS in the differential of these smoldering symptoms.  We will trial low-dose Linzess to manage her current symptoms.  Would consider repeat colonoscopy to evaluate for segmental colitis associated with diverticulosis if her symptoms persist.  Reviewed recommendations to follow a high fiber diet or to use fiber supplements on a regular basis.  However, there is no need to avoid seeds, corn, berries, and nuts. Using NSAIDs may be associated with a moderately increased risk of occurrence of any episode of diverticulitis and complicated diverticulitis. Could consider surgical consultation for possible resection with recurrent symptoms, particularly if CT scan shows recurrent disease in the descending colon.   PLAN: High fiber diet, drink plenty of fluids Avoid all NSAIDs Trial of Linzess 145 mcg once daily Plan CT abd/pelvis with any acute symptoms Return in 3 months, or earlier   Please see the "Patient Instructions" section for addition details about the plan.  HPI: Alexandra Cortez is a 28 y.o. female self-referred for further evaluation of diverticulosis. She was previously followed by Dr. [name], but felt it was "too hard to get an appointment" and requested a transfer to [facility]. The history is obtained through the patient and review of her electronic health record.  She wanted a second opinion. She has a history of kidney stones requiring kidney stones requiring lithotripsy x 2.   Evaluated for abdominal pain, blood and mucousy stools 04/12/18  Colonoscopy 8/18  showed pancolonic diverticulosis Recurrent acute diverticulitis 4/18 (CT), 1/19, 4/19 , 6/19 (CT with descending diverticulitis) with treatment x 3 for ongoing symptoms at that time. Care managed over the phone in 2019 and Shavonn was concerned about the safety of taking so many antibiotics in such a short period of time.  Ultimately had C Dff 04/13/18 treated with vancomycin 125mg  QID x 10 days. Surgery discussed but not planned. On Florastor since that time. Eats Greek yogurt every morning. Drinks Kombucha.   Doing well until last month. Recurrent symptoms but no fever, and fever has been a part of her constellation of symptoms with acute diverticulitis.. Currently having baseline bowel habits with a bowel movement every 1-3 days, alternating with diarrhea. Constipation is the predominant symptoms.  Significant bloating when she feels constipation.  Has tried Miralax and Metamucil - provide some initial relief but no long term improvement.  No blood or mucus. Pain with episodes is always in the same location.  Good appetite. Weight is stable.  Concerned about recurrent antibiotics with resistance.  No other associated symptoms. No identified exacerbating or relieving features.   Uses ibuprofen weekly to twice weekly for headaches.   Mother had colon polyps and diverticulitis.  No other known family history of colon cancer or polyps. No family history of uterine/endometrial cancer, pancreatic cancer or gastric/stomach cancer. No autoimmune disease.    Past Medical History:  Diagnosis Date  . Frequency of urination   . Hematuria   . Left flank pain   . Left ureteral calculus   . Nocturia   . Urgency of urination     Past Surgical History:  Procedure Laterality Date  . CYSTOSCOPY W/ RETROGRADES  06/14/2012   Procedure: CYSTOSCOPY WITH  RETROGRADE PYELOGRAM;  Surgeon: Garnett Farm, MD;  Location: [facility];  Service: Urology;  Laterality: Left;  .  CYSTOSCOPY WITH URETEROSCOPY  06/14/2012   Procedure: CYSTOSCOPY WITH URETEROSCOPY;  Surgeon: Garnett Farm, MD;  Location: [facility];  Service: Urology;  Laterality: Left;  . EXTRACORPOREAL SHOCK WAVE LITHOTRIPSY  APR 2013   LEFT SIDE--  [facility]   . WISDOM TOOTH EXTRACTION  2012    Current Outpatient Medications  Medication Sig Dispense Refill  . fluticasone (FLONASE) 50 MCG/ACT nasal spray Place 1 spray into both nostrils daily as needed for allergies or rhinitis.    Marland Kitchen ibuprofen (ADVIL,MOTRIN) 200 MG tablet Take 600-800 mg by mouth every 6 (six) hours as needed for moderate pain.     No current facility-administered medications for this visit.     Allergies as of 07/06/2019  . (No Known Allergies)    Family History  Problem Relation Age of Onset  . Esophageal cancer Maternal Grandfather   . Breast cancer Paternal Grandmother   . Colon cancer Neg Hx   . Liver cancer Neg Hx   . Pancreatic cancer Neg Hx   . Rectal cancer Neg Hx   . Stomach cancer Neg Hx     Social History   Socioeconomic History  . Marital status: Media planner    Spouse name: Engineer, maintenance  . Number of children: Not on file  . Years of education: Not on file  . Highest education level: Not on file  Occupational History  . Not on file  Social Needs  . Financial resource strain: Not on file  . Food insecurity    Worry: Not on file    Inability: Not on file  . Transportation needs    Medical: Not on file    Non-medical: Not on file  Tobacco Use  . Smoking status: Former Smoker    Packs/day: 0.50    Years: 0.60    Pack years: 0.30    Types: Cigarettes    Quit date: 10/06/2012    Years since quitting: 6.7  . Smokeless tobacco: Never Used  Substance and Sexual Activity  . Alcohol use: Yes    Comment: OCCASIONAL  . Drug use: Yes    Types: Marijuana    Comment: OCCASIONAL MARIJUANA-- LAST USED 2 WKS AGO  . Sexual activity: Yes    Birth control/protection: None   Lifestyle  . Physical activity    Days per week: Not on file    Minutes per session: Not on file  . Stress: Not on file  Relationships  . Social Musician on phone: Not on file    Gets together: Not on file    Attends religious service: Not on file    Active member of club or organization: Not on file    Attends meetings of clubs or organizations: Not on file    Relationship status: Not on file  . Intimate partner violence    Fear of current or ex partner: Not on file    Emotionally abused: Not on file    Physically abused: Not on file    Forced sexual activity: Not on file  Other Topics Concern  . Not on file  Social History Narrative  . Not on file    Review of Systems: 12 system ROS is negative except as noted above in addition to allergies, back pain, and headaches.   Physical Exam: General:   Alert,  well-nourished, pleasant and cooperative in NAD Head:  Normocephalic and atraumatic. Eyes:  Sclera clear, no icterus.   Conjunctiva pink. Ears:  Normal auditory acuity. Nose:  No deformity, discharge,  or lesions. Mouth:  No deformity or lesions.   Neck:  Supple; no masses or thyromegaly. Lungs:  Clear throughout to auscultation.   No wheezes. Heart:  Regular rate and rhythm; no murmurs. Abdomen:  Soft,nontender, nondistended, normal bowel sounds, no rebound or guarding. No hepatosplenomegaly.   Rectal:  Deferred  Msk:  Symmetrical. No boney deformities LAD: No inguinal or umbilical LAD Extremities:  No clubbing or edema. Neurologic:  Alert and  oriented x4;  grossly nonfocal Skin:  Intact without significant lesions or rashes. Psych:  Alert and cooperative. Normal mood and affect.     Shams Fill L. Orvan FalconerBeavers, MD, MPH 07/06/2019, 11:11 AM

## 2019-07-06 NOTE — Patient Instructions (Addendum)
-   Please drink plenty of water. I recommend at least 64 ounces daily. - Follow a high fiber diet and use fiber supplements on a regular basis. A stool bulking agent such as Metamucil may help decrease recurrent diverticulitis.  - There is no need to avoid seeds, nuts, corn, and berries. - Non-steroidal antiinflammatory medications (such as ibuprofen) may increase your risk for a recurrent of diverticulitis. Please avoid these medications whenever possible. Please talk to Dr. Drema Dallas about alternative to ibuprofen to treat your headaches.  - I recommend a trial of Linzess 145 mcg daily to try to help with your constipation and bloating.  - Please call with my any recurrent symptoms. I would recommend a CT scan at that time.  - Let's plan to check in again in 3 months, or earlier as needed.  Fiber Chart  You should be consuming 25-30g of fiber per day and drinking 8 glasses of water to help your bowels move regularly.  In the chart below you can look up how much fiber you are getting in an average day.  If you are not getting enough fiber, you should add a fiber supplement to your diet.  Examples of this include Metamucil, FiberCon and Citrucel.  These can be purchased at your local grocery store or pharmacy.      [email or web address]

## 2019-08-11 ENCOUNTER — Telehealth: Payer: Self-pay | Admitting: Gastroenterology

## 2019-08-11 MED ORDER — SACCHAROMYCES BOULARDII 250 MG PO CAPS: 250.0000 mg | ORAL_CAPSULE | Freq: Two times a day (BID) | ORAL | 3 refills | Status: DC

## 2019-08-11 NOTE — Telephone Encounter (Signed)
Rx sent to pharmacy   

## 2019-08-26 ENCOUNTER — Telehealth: Payer: Self-pay

## 2019-08-26 NOTE — Telephone Encounter (Signed)
[employer] approved linzess 162mcg capsules from 07/08/2019 to 07/06/2020 Reference number [id number]

## 2019-09-05 DIAGNOSIS — Z20828 Contact with and (suspected) exposure to other viral communicable diseases: Secondary | ICD-10-CM | POA: Diagnosis not present

## 2019-10-17 ENCOUNTER — Telehealth: Payer: Self-pay | Admitting: Gastroenterology

## 2019-10-17 NOTE — Telephone Encounter (Signed)
Spoke to the patient who reports she felt like she was on the "tailend of a flare." Per ov notes on 07/06/19, the patient was to be seen in the office in 3 months. The patient had not been scheduled but is now scheduled on 11/10/19 at 3:40 pm. The patient reports she is back to solid foods and feels like she did not need an earlier appt with an APP.

## 2019-11-10 ENCOUNTER — Ambulatory Visit (INDEPENDENT_AMBULATORY_CARE_PROVIDER_SITE_OTHER): Payer: BC Managed Care – PPO | Admitting: Gastroenterology

## 2019-11-10 ENCOUNTER — Encounter: Payer: Self-pay | Admitting: Gastroenterology

## 2019-11-10 ENCOUNTER — Other Ambulatory Visit: Payer: Self-pay

## 2019-11-10 VITALS — BP 104/62 | HR 65 | Temp 98.3°F | Ht 61.0 in | Wt 154.0 lb

## 2019-11-10 DIAGNOSIS — K5732 Diverticulitis of large intestine without perforation or abscess without bleeding: Secondary | ICD-10-CM | POA: Diagnosis not present

## 2019-11-10 DIAGNOSIS — K5900 Constipation, unspecified: Secondary | ICD-10-CM

## 2019-11-10 DIAGNOSIS — R140 Abdominal distension (gaseous): Secondary | ICD-10-CM

## 2019-11-10 NOTE — Progress Notes (Signed)
Referring Provider: Juluis Rainier, MD Primary Care Physician:  Juluis Rainier, MD  Chief complaint:  Recurrent diverticulitis, LLQ pain   IMPRESSION:  LLQ pain Recurrent, acute diverticulitis Pancolonic diverticulosis on colonoscopy 2018 [facility]) C. difficile 2019 Constipation or bloating Weekly ibuprofen for headaches Family history of colon polyps (mother) No known family history of colon cancer  Smoldering left lower quadrant pain raises concerns for possible diverticulitis.  The differential also includes SCAD and post-diverticulitis IBS.  Continue fiber supplements, high-fiber diet, plenty of water, and Florastor.  Will reduce Linzess to 72 mcg daily to see if this improves her symptoms.  Samples provided today.  We will plan contrasted CT to clarify if there is complicated diverticulitis.  Would consider repeat colonoscopy to evaluate for segmental colitis associated with diverticulosis if her symptoms persist and the CT is nondiagnostic.   PLAN: CBC to screen for leukocytosis BMP in preparation for CT scan High fiber diet, drink plenty of fluids Continue fiber supplements BID Continue Florastor Continue to avoid all NSAIDs Reduce Linzess to 72 mcg daily CT abd/pelvis with contrast Return in 1 month, or earlier   Please see the "Patient Instructions" section for addition details about the plan.  HPI: Alexandra Cortez is a 29 y.o. female under evaluation of diverticulosis.  She was originally seen 07/06/2019.  She returns in follow-up. The interval history is obtained through the patient and review of her electronic health record. She has a history of kidney stones requiring kidney stones requiring lithotripsy x 2 and recurrent diverticulitis.   Evaluated for abdominal pain, blood and mucousy stools 04/12/18 Colonoscopy 8/18  showed pancolonic diverticulosis Recurrent acute diverticulitis 4/18 (CT), 1/19, 4/19 , 6/19 (CT with descending diverticulitis) with treatment x  3 for ongoing symptoms at that time. Care managed over the phone in 2019 and Jobeth was concerned about the safety of taking so many antibiotics in such a short period of time.  Ultimately had C Dff 04/13/18 treated with vancomycin 125mg  QID x 10 days. Surgery discussed but not planned. On Florastor since that time. Eats Greek yogurt every morning. Drinks Kombucha.   Recurrent symptoms 8/19 but no fever, and fever has been a part of her constellation of symptoms with acute diverticulitis.. Currently having baseline bowel habits with a bowel movement every 1-3 days, alternating with diarrhea. Constipation is the predominant symptoms.   Significant bloating when she feels constipation.   Has tried Miralax and Metamucil - provide some initial relief but no long term improvement.  No blood or mucus. Pain with episodes is always in the same location.  Good appetite. Weight is stable.  Concerned about recurrent antibiotics with resistance.  Was previously using ibuprofen weekly to twice weekly for headaches.  Stopped last fall.     Feeling better since her last visit.  She finds that taking Linzess 140 mcg daily results in significant watery diarrhea. Works 12-14 hours on the weekend. Cannot take the medication at that time.  Will have hours of diarrhea after taking a dose.  Took it daily for a month.  Then reduced the dose to Monday through Friday so as not to disrupt her work schedule. Currently finds that her symptoms are better improved with less diarrhea if she takes it Wed/Thurs/Friday.  Over the last 30 days she is having more frequently abdominal pain.  Pain is in the same place as her prior episodes of diverticulitis.  She describes it as a "smouldering LLQ pain."   Takes Florastor daily, Fiber BID, and drinks  at least 64 ounces of water daily. She denies the use of any NSAIDs. Normal appetite.  No weight change.  No other associated symptoms.     Past Medical History:  Diagnosis  Date  . Frequency of urination   . Hematuria   . Left flank pain   . Left ureteral calculus   . Nocturia   . Urgency of urination     Past Surgical History:  Procedure Laterality Date  . CYSTOSCOPY W/ RETROGRADES  06/14/2012   Procedure: CYSTOSCOPY WITH RETROGRADE PYELOGRAM;  Surgeon: Garnett Farm, MD;  Location: [facility];  Service: Urology;  Laterality: Left;  . CYSTOSCOPY WITH URETEROSCOPY  06/14/2012   Procedure: CYSTOSCOPY WITH URETEROSCOPY;  Surgeon: Garnett Farm, MD;  Location: [facility];  Service: Urology;  Laterality: Left;  . EXTRACORPOREAL SHOCK WAVE LITHOTRIPSY  APR 2013   LEFT SIDE--  [facility]   . WISDOM TOOTH EXTRACTION  2012    Current Outpatient Medications  Medication Sig Dispense Refill  . fluticasone (FLONASE) 50 MCG/ACT nasal spray Place 1 spray into both nostrils daily as needed for allergies or rhinitis.    Marland Kitchen ibuprofen (ADVIL,MOTRIN) 200 MG tablet Take 600-800 mg by mouth every 6 (six) hours as needed for moderate pain.    Marland Kitchen linaclotide (LINZESS) 145 MCG CAPS capsule Take 1 capsule (145 mcg total) by mouth daily before breakfast. 30 capsule 3  . lubiprostone (AMITIZA) 8 MCG capsule Take 1 capsule (8 mcg total) by mouth 2 (two) times daily with a meal. 60 capsule 3  . saccharomyces boulardii (FLORASTOR) 250 MG capsule Take 1 capsule (250 mg total) by mouth 2 (two) times daily. 60 capsule 3   No current facility-administered medications for this visit.    Allergies as of 11/10/2019  . (No Known Allergies)    Family History  Problem Relation Age of Onset  . Esophageal cancer Maternal Grandfather   . Breast cancer Paternal Grandmother   . Colon cancer Neg Hx   . Liver cancer Neg Hx   . Pancreatic cancer Neg Hx   . Rectal cancer Neg Hx   . Stomach cancer Neg Hx     Social History   Socioeconomic History  . Marital status: Media planner    Spouse name: Engineer, maintenance  . Number of children: Not on file    . Years of education: Not on file  . Highest education level: Not on file  Occupational History  . Not on file  Tobacco Use  . Smoking status: Former Smoker    Packs/day: 0.50    Years: 0.60    Pack years: 0.30    Types: Cigarettes    Quit date: 10/06/2012    Years since quitting: 7.0  . Smokeless tobacco: Never Used  Substance and Sexual Activity  . Alcohol use: Yes    Comment: OCCASIONAL  . Drug use: Yes    Types: Marijuana    Comment: OCCASIONAL MARIJUANA-- LAST USED 2 WKS AGO  . Sexual activity: Yes    Birth control/protection: None  Other Topics Concern  . Not on file  Social History Narrative  . Not on file   Social Determinants of Health   Financial Resource Strain:   . Difficulty of Paying Living Expenses: Not on file  Food Insecurity:   . Worried About Programme researcher, broadcasting/film/video in the Last Year: Not on file  . Ran Out of Food in the Last Year: Not on file  Transportation  Needs:   . Lack of Transportation (Medical): Not on file  . Lack of Transportation (Non-Medical): Not on file  Physical Activity:   . Days of Exercise per Week: Not on file  . Minutes of Exercise per Session: Not on file  Stress:   . Feeling of Stress : Not on file  Social Connections:   . Frequency of Communication with Friends and Family: Not on file  . Frequency of Social Gatherings with Friends and Family: Not on file  . Attends Religious Services: Not on file  . Active Member of Clubs or Organizations: Not on file  . Attends Archivist Meetings: Not on file  . Marital Status: Not on file  Intimate Partner Violence:   . Fear of Current or Ex-Partner: Not on file  . Emotionally Abused: Not on file  . Physically Abused: Not on file  . Sexually Abused: Not on file    Review of Systems: 12 system ROS is negative except as noted above in addition to allergies, back pain, and headaches.   Physical Exam: General:   Alert,  well-nourished, pleasant and cooperative in NAD Head:   Normocephalic and atraumatic. Eyes:  Sclera clear, no icterus.   Conjunctiva pink. Ears:  Normal auditory acuity. Nose:  No deformity, discharge,  or lesions. Mouth:  No deformity or lesions.   Neck:  Supple; no masses or thyromegaly. Lungs:  Clear throughout to auscultation.   No wheezes. Heart:  Regular rate and rhythm; no murmurs. Abdomen:  Soft,nontender, nondistended, normal bowel sounds, no rebound or guarding. No hepatosplenomegaly.   Rectal:  Deferred  Msk:  Symmetrical. No boney deformities LAD: No inguinal or umbilical LAD Extremities:  No clubbing or edema. Neurologic:  Alert and  oriented x4;  grossly nonfocal Skin:  Intact without significant lesions or rashes. Psych:  Alert and cooperative. Normal mood and affect.     Avenly Roberge L. Tarri Glenn, MD, MPH 11/10/2019, 3:46 PM

## 2019-11-10 NOTE — Patient Instructions (Addendum)
A high fiber diet with plenty of fluids (up to 8 glasses of water daily) is suggested to relieve these symptoms.  Metamucil, 1 tablespoon once or twice daily can be used to keep bowels regular if needed.  Avoid all NSAIDs.   Reduce Linzess to 72 mcg daily.   Continue fiber supplements twice a day.   Continue Florastor.  You have been scheduled for a CT scan of the abdomen and pelvis at [place] are scheduled on 11-16-19 at 3:00 pm. You should arrive 15 minutes prior to your appointment time for registration. Please follow the written instructions below on the day of your exam:  WARNING: IF YOU ARE ALLERGIC TO IODINE/X-RAY DYE, PLEASE NOTIFY RADIOLOGY IMMEDIATELY AT [phone]! YOU WILL BE GIVEN A 13 HOUR PREMEDICATION PREP.  1) Do not eat or drink anything after 11:00 am (4 hours prior to your test) 2) You have been given 2 bottles of oral contrast to drink. The solution may taste better if refrigerated, but do NOT add ice or any other liquid to this solution. Shake well before drinking.    Drink 1 bottle of contrast @ 1:00 pm (2 hours prior to your exam)  Drink 1 bottle of contrast @ 2:00 pm (1 hour prior to your exam)  You may take any medications as prescribed with a small amount of water, if necessary. If you take any of the following medications: METFORMIN, GLUCOPHAGE, GLUCOVANCE, AVANDAMET, RIOMET, FORTAMET, [place] MET, JANUMET, GLUMETZA or METAGLIP, you MAY be asked to HOLD this medication 48 hours AFTER the exam.  The purpose of you drinking the oral contrast is to aid in the visualization of your intestinal tract. The contrast solution may cause some diarrhea. Depending on your individual set of symptoms, you may also receive an intravenous injection of x-ray contrast/dye. Plan on being at [facility] for 30 minutes or longer, depending on the type of exam you are having performed.  This test typically takes 30-45 minutes to complete.  If you have any  questions regarding your exam or if you need to reschedule, you may call the CT department at [phone] between the hours of 8:00 am and 5:00 pm, Monday-Friday.  ________________________________________________________________________

## 2019-11-16 ENCOUNTER — Ambulatory Visit (HOSPITAL_COMMUNITY)
Admission: RE | Admit: 2019-11-16 | Discharge: 2019-11-16 | Disposition: A | Discharge status: Home / Self Care | Payer: BC Managed Care – PPO | Source: Ambulatory Visit | Attending: Gastroenterology | Admitting: Gastroenterology

## 2019-11-16 ENCOUNTER — Other Ambulatory Visit: Payer: Self-pay

## 2019-11-16 DIAGNOSIS — K5900 Constipation, unspecified: Secondary | ICD-10-CM | POA: Diagnosis not present

## 2019-11-16 DIAGNOSIS — R109 Unspecified abdominal pain: Secondary | ICD-10-CM | POA: Diagnosis not present

## 2019-11-16 DIAGNOSIS — K5732 Diverticulitis of large intestine without perforation or abscess without bleeding: Secondary | ICD-10-CM | POA: Diagnosis not present

## 2019-11-16 DIAGNOSIS — R140 Abdominal distension (gaseous): Secondary | ICD-10-CM | POA: Insufficient documentation

## 2019-11-16 MED ORDER — SODIUM CHLORIDE (PF) 0.9 % IJ SOLN
INTRAMUSCULAR | Status: AC
  Filled 2019-11-16: qty 50

## 2019-11-16 MED ORDER — IOHEXOL 300 MG/ML  SOLN
100.0000 mL | Freq: Once | INTRAMUSCULAR | Status: AC | PRN
  Administered 2019-11-16: 100 mL via INTRAVENOUS

## 2019-11-17 ENCOUNTER — Encounter: Payer: Self-pay | Admitting: *Deleted

## 2019-12-13 ENCOUNTER — Ambulatory Visit: Payer: BC Managed Care – PPO | Admitting: Gastroenterology

## 2019-12-19 ENCOUNTER — Ambulatory Visit (INDEPENDENT_AMBULATORY_CARE_PROVIDER_SITE_OTHER): Payer: BC Managed Care – PPO | Admitting: Gastroenterology

## 2019-12-19 ENCOUNTER — Encounter: Payer: Self-pay | Admitting: Gastroenterology

## 2019-12-19 VITALS — BP 104/60 | HR 72 | Temp 97.7°F | Ht 61.0 in | Wt 150.4 lb

## 2019-12-19 DIAGNOSIS — K5900 Constipation, unspecified: Secondary | ICD-10-CM

## 2019-12-19 DIAGNOSIS — K5731 Diverticulosis of large intestine without perforation or abscess with bleeding: Secondary | ICD-10-CM | POA: Diagnosis not present

## 2019-12-19 NOTE — Patient Instructions (Signed)
If you are age 29 or older, your body mass index should be between 23-30. Your Body mass index is 28.41 kg/m. If this is out of the aforementioned range listed, please consider follow up with your Primary Care Provider.  If you are age 57 or younger, your body mass index should be between 19-25. Your Body mass index is 28.41 kg/m. If this is out of the aformentioned range listed, please consider follow up with your Primary Care Provider.   Continue your high fiber diet and drink plenty of fluids.  Continue your fiber supplements twice daily and taking your Florastor.  Continue to avoid all NSAIDS  Take your Linzess 72 mcg as needed.  Dr. Orvan Falconer would like for you to follow up in 6 months. Please contact the office closer to time to schedule your appointment.

## 2019-12-19 NOTE — Progress Notes (Signed)
Referring Provider: Juluis Rainier, MD Primary Care Physician:  Juluis Rainier, MD  Chief complaint:  Recurrent diverticulitis, LLQ pain   IMPRESSION:  Pancolonic divericulitis with history of recurrent, acute diverticulitis    - Pancolonic diverticulosis on colonoscopy 2018 Marca Ancona)    - Pancolonic diverticulosis on CT scan 11/17/2019 Chronic constipation with associated bloating C. difficile 2019 Weekly ibuprofen for headaches, stopped last fall Family history of colon polyps (mother) No known family history of colon cancer  Diverticulosis with recurrent diverticulitis: CT scan showed no acute diverticulitis last month.  No ongoing symptoms related to diverticulitis.  Using NSAIDs may be associated with a moderately increased risk of occurrence of any episode of diverticulitis and complicated diverticulitis. Would consider repeat colonoscopy to evaluate for segmental colitis associated with diverticulosis if her symptoms persist and CT is non-diagnostic.   Chronic constipation with associated bloating: Continue fiber supplements, high-fiber diet, plenty of water, and Florastor. Use Linzess to 72 mcg PRN.    Family history of colon polyps: Start colon cancer screening at age 73.   PLAN: Continue high fiber diet, drink plenty of fluids Continue Florastor Continue fiber supplements BID Continue to avoid all NSAIDs Only use Linzess 72 mcg daily PRN Follow-up in 6 months, earlier if needed  Please see the "Patient Instructions" section for addition details about the plan.  HPI: Alexandra Cortez is a 29 y.o. female under evaluation of diverticulosis.  She was originally seen 07/06/2019 with follow-up 11/10/19. She returns today in scheduled follow-up to review her recent CT scan results. The interval history is obtained through the patient and review of her electronic health record. She has a history of kidney stones requiring kidney stones requiring lithotripsy x 2 and recurrent  diverticulitis.   Significant chronic constipation.  Has tried Miralax and Metamucil - provide some initial relief but no long term improvement. Linzess 140 mcg daily results in significant watery diarrhea. Cannot take the medication around work schedule.   Evaluated for abdominal pain, blood and mucousy stools 04/12/18 Colonoscopy 8/18  showed pancolonic diverticulosis Recurrent acute diverticulitis 4/18 (CT), 1/19, 4/19 , 6/19 (CT with descending diverticulitis) with treatment x 3 for ongoing symptoms at that time. Care managed over the phone in 2019 and Alexandra Cortez was concerned about the safety of taking so many antibiotics in such a short period of time. Concerned about antibiotic resistance.  Ultimately had C Dff 04/13/18 treated with vancomycin 125mg  QID x 10 days. Surgery discussed but not planned. Recurrent symptoms 8/19 but no fever, and fever has been a part of her constellation of symptoms with acute diverticulitis Recurrent symptoms in 10/2019 with smouldering LLQ pain in the same location of prior episodes of diverticulitis. No fever. CT 11/17/19 showed no acute diverticulitis.   No recurrent symptoms since the CT scan. Takes Florastor daily, Fiber BID, and drinks at least 64 ounces of water daily. Eats Greek yogurt every morning. Drinks Kombucha.   Uses Linzess at the lower dose PRN.  She denies the use of any NSAIDs at this time.   Recently started Weight Watchers. Since then, she will use Florastor. Does not need fiber or Linzess. Has one formed BM without urgency. She feels like her bowel habits are now normal.  Overall feeling much better. No new complaints or concerns at this time.   Past Medical History:  Diagnosis Date  . Frequency of urination   . Hematuria   . Left flank pain   . Left ureteral calculus   . Nocturia   .  Urgency of urination     Past Surgical History:  Procedure Laterality Date  . CYSTOSCOPY W/ RETROGRADES  06/14/2012   Procedure: CYSTOSCOPY WITH RETROGRADE  PYELOGRAM;  Surgeon: Garnett Farm, MD;  Location: [facility];  Service: Urology;  Laterality: Left;  . CYSTOSCOPY WITH URETEROSCOPY  06/14/2012   Procedure: CYSTOSCOPY WITH URETEROSCOPY;  Surgeon: Garnett Farm, MD;  Location: [facility];  Service: Urology;  Laterality: Left;  . EXTRACORPOREAL SHOCK WAVE LITHOTRIPSY  APR 2013   LEFT SIDE--  [facility]   . WISDOM TOOTH EXTRACTION  2012    Current Outpatient Medications  Medication Sig Dispense Refill  . fluticasone (FLONASE) 50 MCG/ACT nasal spray Place 1 spray into both nostrils daily as needed for allergies or rhinitis.    Marland Kitchen ibuprofen (ADVIL,MOTRIN) 200 MG tablet Take 600-800 mg by mouth every 6 (six) hours as needed for moderate pain.    Marland Kitchen linaclotide (LINZESS) 145 MCG CAPS capsule Take 1 capsule (145 mcg total) by mouth daily before breakfast. (Patient taking differently: Take 145 mcg by mouth as needed. ) 30 capsule 3  . saccharomyces boulardii (FLORASTOR) 250 MG capsule Take 1 capsule (250 mg total) by mouth 2 (two) times daily. 60 capsule 3   No current facility-administered medications for this visit.    Allergies as of 12/19/2019  . (No Known Allergies)    Family History  Problem Relation Age of Onset  . Esophageal cancer Maternal Grandfather   . Breast cancer Paternal Grandmother   . Colon cancer Neg Hx   . Liver cancer Neg Hx   . Pancreatic cancer Neg Hx   . Rectal cancer Neg Hx   . Stomach cancer Neg Hx     Social History   Socioeconomic History  . Marital status: Media planner    Spouse name: Engineer, maintenance  . Number of children: Not on file  . Years of education: Not on file  . Highest education level: Not on file  Occupational History  . Not on file  Tobacco Use  . Smoking status: Former Smoker    Packs/day: 0.50    Years: 0.60    Pack years: 0.30    Types: Cigarettes    Quit date: 10/06/2012    Years since quitting: 7.2  . Smokeless tobacco: Never Used    Substance and Sexual Activity  . Alcohol use: Yes    Comment: OCCASIONAL  . Drug use: Yes    Types: Marijuana    Comment: OCCASIONAL MARIJUANA-- LAST USED 2 WKS AGO  . Sexual activity: Yes    Birth control/protection: None  Other Topics Concern  . Not on file  Social History Narrative  . Not on file   Social Determinants of Health   Financial Resource Strain:   . Difficulty of Paying Living Expenses:   Food Insecurity:   . Worried About Programme researcher, broadcasting/film/video in the Last Year:   . Barista in the Last Year:   Transportation Needs:   . Freight forwarder (Medical):   Marland Kitchen Lack of Transportation (Non-Medical):   Physical Activity:   . Days of Exercise per Week:   . Minutes of Exercise per Session:   Stress:   . Feeling of Stress :   Social Connections:   . Frequency of Communication with Friends and Family:   . Frequency of Social Gatherings with Friends and Family:   . Attends Religious Services:   . Active Member of Clubs  or Organizations:   . Attends Archivist Meetings:   Marland Kitchen Marital Status:   Intimate Partner Violence:   . Fear of Current or Ex-Partner:   . Emotionally Abused:   Marland Kitchen Physically Abused:   . Sexually Abused:     Physical Exam: General:   Alert,  well-nourished, pleasant and cooperative in NAD Head:  Normocephalic and atraumatic. Eyes:  Sclera clear, no icterus.   Conjunctiva pink. Msk:  Symmetrical. No boney deformities Extremities:  No clubbing or edema. Neurologic:  Alert and  oriented x4;  grossly nonfocal Skin: No obvious rash or bruise. Psych:  Alert and cooperative. Normal mood and affect.     Jamesyn Moorefield L. Tarri Glenn, MD, MPH 12/19/2019, 1:58 PM

## 2019-12-28 DIAGNOSIS — U071 COVID-19: Secondary | ICD-10-CM | POA: Diagnosis not present

## 2020-03-21 DIAGNOSIS — H6983 Other specified disorders of Eustachian tube, bilateral: Secondary | ICD-10-CM | POA: Diagnosis not present

## 2020-08-03 ENCOUNTER — Ambulatory Visit: Payer: BC Managed Care – PPO | Admitting: Gastroenterology

## 2020-08-10 ENCOUNTER — Encounter: Payer: Self-pay | Admitting: Gastroenterology

## 2020-08-10 ENCOUNTER — Ambulatory Visit: Payer: BC Managed Care – PPO | Admitting: Gastroenterology

## 2020-08-10 ENCOUNTER — Ambulatory Visit (INDEPENDENT_AMBULATORY_CARE_PROVIDER_SITE_OTHER): Payer: BC Managed Care – PPO | Admitting: Gastroenterology

## 2020-08-10 VITALS — BP 102/64 | HR 70 | Ht 61.0 in | Wt 139.0 lb

## 2020-08-10 DIAGNOSIS — K5900 Constipation, unspecified: Secondary | ICD-10-CM

## 2020-08-10 DIAGNOSIS — K5732 Diverticulitis of large intestine without perforation or abscess without bleeding: Secondary | ICD-10-CM | POA: Diagnosis not present

## 2020-08-10 DIAGNOSIS — R1011 Right upper quadrant pain: Secondary | ICD-10-CM

## 2020-08-10 MED ORDER — DICYCLOMINE HCL 10 MG PO CAPS: 10.0000 mg | ORAL_CAPSULE | Freq: Three times a day (TID) | ORAL | 11 refills | Status: DC

## 2020-08-10 MED ORDER — SACCHAROMYCES BOULARDII 250 MG PO CAPS: 250.0000 mg | ORAL_CAPSULE | Freq: Every day | ORAL | 3 refills | Status: DC

## 2020-08-10 NOTE — Patient Instructions (Addendum)
Continue High fiber diet and drink plenty of fluids.   Continue fiber supplements.   Continue to avoid all NSAID's.  We have sent the following medications to your pharmacy for you to pick up at your convenience: Florastor and dicyclomine.   Please call our office back if you any symptoms.   Normal BMI (Body Mass Index- based on height and weight) is between 19 and 25. Your BMI today is Body mass index is 26.26 kg/m. Marland Kitchen Please consider follow up  regarding your BMI with your Primary Care Provider.

## 2020-08-10 NOTE — Progress Notes (Signed)
Referring Provider: Juluis Rainier, MD Primary Care Physician:  Juluis Rainier, MD  Chief complaint:  Recurrent diverticulitis, LLQ pain   IMPRESSION:  Severe RUQ pain 04/2020 - isolated event Pancolonic divericulitis with history of recurrent, acute diverticulitis    - Pancolonic diverticulosis on colonoscopy 2018 Alexandra Cortez)    - Pancolonic diverticulosis on CT scan 11/17/2019 Chronic constipation with associated bloating C. difficile 2019 Weekly ibuprofen for headaches, stopped last fall Family history of colon polyps (mother) No known family history of colon cancer  Severe RUQ pain 04/2020 - isolated event: Differential is broad. By location, less likely to be related to diverticulosis/diverticulitis/SCAD. Must consider symptomatic biliary disease.   Diverticulosis with recurrent diverticulitis: No ongoing symptoms related to diverticulitis.  Using NSAIDs may be associated with a moderately increased risk of occurrence of any episode of diverticulitis and complicated diverticulitis. Would consider repeat colonoscopy to evaluate for segmental colitis associated with diverticulosis if her symptoms persist and CT is non-diagnostic.   Chronic constipation with associated bloating: Continue fiber supplements, high-fiber diet, plenty of water, and Florastor. Use Linzess to 72 mcg PRN. Add dicyclomine for cramping.    Family history of colon polyps: Start colon cancer screening at age 41.   PLAN: Continue high fiber diet, drink plenty of fluids Continue Florastor (3 month supply with 3 refills) Continue fiber supplements BID Continue to avoid all NSAIDs May use dicyclomine 20 mg QID PRN abdominal pain If she calls with recurrent RUQ pain, proceed with abdominal ultrasound +/- HIDA Follow-up PRN, at least annually  Please see the "Patient Instructions" section for addition details about the plan.  HPI: Alexandra Cortez is a 29 y.o. female under evaluation of diverticulitis. She  interval history is obtained through the patient and review of her electronic health record. She has a history of kidney stones requiring kidney stones requiring lithotripsy x 2 and recurrent diverticulitis.   Evaluated for abdominal pain, blood and mucousy stools 04/12/18 Colonoscopy 8/18  showed pancolonic diverticulosis Recurrent acute diverticulitis 4/18 (CT), 1/19, 4/19 , 6/19 (CT with descending diverticulitis) with treatment x 3 for ongoing symptoms at that time. Care managed over the phone in 2019 and Alexandra Cortez was concerned about the safety of taking so many antibiotics in such a short period of time. Concerned about antibiotic resistance.  Ultimately had C Dff 04/13/18 treated with vancomycin 125mg  QID x 10 days. Surgery discussed but not planned. Recurrent symptoms 8/19 but no fever, and fever has been a part of her constellation of symptoms with acute diverticulitis Recurrent symptoms in 10/2019 with smouldering LLQ pain in the same location of prior episodes of diverticulitis. No fever. CT 11/17/19 showed no acute diverticulitis  Returns today in scheduled follow-up. No abdominal pain since the CT using Florastor, Fiber BID, and drinking at least 64 ounces of water daily. Eats Greek yogurt every morning. Drinks Kombucha.   Overall doing much better. Had an episode of severe RUQ pain in July. Had just returned from vacation where she ate moregreasy and fried foods than normal. Considered going to the ED. Symptoms resolved after one week of bowel rest, ibuprofen, and rest.  She wondered if it was her gallbladder because her mother had serious gallbladder problems presenting with similar symptoms.   Significant chronic constipation.  Has tried Miralax and Metamucil - provide some initial relief but no long term improvement. Linzess 140 mcg daily results in significant watery diarrhea. Cannot take the medication around work schedule.    Past Medical History:  Diagnosis Date  .  Frequency of  urination   . Hematuria   . Left flank pain   . Left ureteral calculus   . Nocturia   . Urgency of urination     Past Surgical History:  Procedure Laterality Date  . CYSTOSCOPY W/ RETROGRADES  06/14/2012   Procedure: CYSTOSCOPY WITH RETROGRADE PYELOGRAM;  Surgeon: Garnett Farm, MD;  Location: [facility];  Service: Urology;  Laterality: Left;  . CYSTOSCOPY WITH URETEROSCOPY  06/14/2012   Procedure: CYSTOSCOPY WITH URETEROSCOPY;  Surgeon: Garnett Farm, MD;  Location: [facility];  Service: Urology;  Laterality: Left;  . EXTRACORPOREAL SHOCK WAVE LITHOTRIPSY  APR 2013   LEFT SIDE--  [facility]   . WISDOM TOOTH EXTRACTION  2012    Current Outpatient Medications  Medication Sig Dispense Refill  . fluticasone (FLONASE) 50 MCG/ACT nasal spray Place 1 spray into both nostrils daily as needed for allergies or rhinitis.    Marland Kitchen ibuprofen (ADVIL,MOTRIN) 200 MG tablet Take 600-800 mg by mouth every 6 (six) hours as needed for moderate pain.    Marland Kitchen linaclotide (LINZESS) 145 MCG CAPS capsule Take 1 capsule (145 mcg total) by mouth daily before breakfast. (Patient taking differently: Take 145 mcg by mouth as needed. ) 30 capsule 3  . saccharomyces boulardii (FLORASTOR) 250 MG capsule Take 1 capsule (250 mg total) by mouth 2 (two) times daily. 60 capsule 3   No current facility-administered medications for this visit.    Allergies as of 08/10/2020  . (No Known Allergies)    Family History  Problem Relation Age of Onset  . Esophageal cancer Maternal Grandfather   . Breast cancer Paternal Grandmother   . Colon cancer Neg Hx   . Liver cancer Neg Hx   . Pancreatic cancer Neg Hx   . Rectal cancer Neg Hx   . Stomach cancer Neg Hx     Social History   Socioeconomic History  . Marital status: Media planner    Spouse name: Engineer, maintenance  . Number of children: Not on file  . Years of education: Not on file  . Highest education level: Not on file    Occupational History  . Not on file  Tobacco Use  . Smoking status: Former Smoker    Packs/day: 0.50    Years: 0.60    Pack years: 0.30    Types: Cigarettes    Quit date: 10/06/2012    Years since quitting: 7.8  . Smokeless tobacco: Never Used  Vaping Use  . Vaping Use: Never used  Substance and Sexual Activity  . Alcohol use: Yes    Comment: OCCASIONAL  . Drug use: Yes    Types: Marijuana    Comment: OCCASIONAL MARIJUANA-- LAST USED 2 WKS AGO  . Sexual activity: Yes    Birth control/protection: None  Other Topics Concern  . Not on file  Social History Narrative  . Not on file   Social Determinants of Health   Financial Resource Strain:   . Difficulty of Paying Living Expenses: Not on file  Food Insecurity:   . Worried About Programme researcher, broadcasting/film/video in the Last Year: Not on file  . Ran Out of Food in the Last Year: Not on file  Transportation Needs:   . Lack of Transportation (Medical): Not on file  . Lack of Transportation (Non-Medical): Not on file  Physical Activity:   . Days of Exercise per Week: Not on file  . Minutes of Exercise per Session: Not  on file  Stress:   . Feeling of Stress : Not on file  Social Connections:   . Frequency of Communication with Friends and Family: Not on file  . Frequency of Social Gatherings with Friends and Family: Not on file  . Attends Religious Services: Not on file  . Active Member of Clubs or Organizations: Not on file  . Attends Banker Meetings: Not on file  . Marital Status: Not on file  Intimate Partner Violence:   . Fear of Current or Ex-Partner: Not on file  . Emotionally Abused: Not on file  . Physically Abused: Not on file  . Sexually Abused: Not on file    Physical Exam: General:   Alert,  well-nourished, pleasant and cooperative in NAD Head:  Normocephalic and atraumatic. Eyes:  Sclera clear, no icterus.   Conjunctiva pink. Msk:  Symmetrical. No boney deformities Extremities:  No clubbing or  edema. Neurologic:  Alert and  oriented x4;  grossly nonfocal Skin: No obvious rash or bruise. Psych:  Alert and cooperative. Normal mood and affect.     Tyqwan Pink L. Orvan Falconer, MD, MPH 08/10/2020, 1:04 PM

## 2020-08-11 ENCOUNTER — Other Ambulatory Visit: Payer: Self-pay | Admitting: Gastroenterology

## 2020-08-14 DIAGNOSIS — N63 Unspecified lump in unspecified breast: Secondary | ICD-10-CM | POA: Diagnosis not present

## 2020-08-17 ENCOUNTER — Other Ambulatory Visit: Payer: Self-pay | Admitting: Family Medicine

## 2020-08-17 DIAGNOSIS — N63 Unspecified lump in unspecified breast: Secondary | ICD-10-CM

## 2020-09-13 DIAGNOSIS — B029 Zoster without complications: Secondary | ICD-10-CM | POA: Diagnosis not present

## 2020-09-13 DIAGNOSIS — M62830 Muscle spasm of back: Secondary | ICD-10-CM | POA: Diagnosis not present

## 2020-09-13 DIAGNOSIS — H6503 Acute serous otitis media, bilateral: Secondary | ICD-10-CM | POA: Diagnosis not present

## 2020-09-19 ENCOUNTER — Ambulatory Visit
Admission: RE | Admit: 2020-09-19 | Discharge: 2020-09-19 | Disposition: A | Discharge status: Home / Self Care | Payer: BC Managed Care – PPO | Source: Ambulatory Visit | Attending: Family Medicine | Admitting: Family Medicine

## 2020-09-19 ENCOUNTER — Other Ambulatory Visit: Payer: Self-pay

## 2020-09-19 DIAGNOSIS — N63 Unspecified lump in unspecified breast: Secondary | ICD-10-CM

## 2020-09-19 DIAGNOSIS — N6489 Other specified disorders of breast: Secondary | ICD-10-CM | POA: Diagnosis not present

## 2020-10-08 DIAGNOSIS — Z20828 Contact with and (suspected) exposure to other viral communicable diseases: Secondary | ICD-10-CM | POA: Diagnosis not present

## 2020-11-09 ENCOUNTER — Telehealth: Payer: Self-pay | Admitting: Gastroenterology

## 2020-11-09 NOTE — Telephone Encounter (Signed)
Pt is requesting If it is possible to obtain a medication refill for her [facility].

## 2020-11-09 NOTE — Telephone Encounter (Signed)
After review of pt chart, it appears that the Rx was sent x2 with 60 refills 08/2020 and x1 with 6 refills 08/2020. Called pt pharmacy to f/u. Confirmed they do have the Rx and it does have plenty of refills.   Called pt and advised her about above conversation. Advised she call the pharmacy to f/u. Verbalized acceptance and understanding.

## 2021-03-06 ENCOUNTER — Telehealth: Payer: Self-pay | Admitting: Gastroenterology

## 2021-03-06 DIAGNOSIS — R197 Diarrhea, unspecified: Secondary | ICD-10-CM

## 2021-03-06 NOTE — Telephone Encounter (Signed)
Sending this to DOD since Dr. Daleen Bo is out of the office and patient has not seen an app. Called patient back and she has been having mucusy stools and bloating the last few weeks; but symptoms have gotten much worse the last 48 hrs. She had about 12 loose, mucus/thin BMs yesterday, along with nausea and lower abd. cramping. She took [employer] today and has only had 3 stools since. She has been just taking her Bentyl PRN, and I asked her to take it 4 times a day, before meals and at bedtime. She says the stools are similar to when she had C-Diff in the past. Please advise.

## 2021-03-06 NOTE — Telephone Encounter (Signed)
Inbound call from patient and she stated that she is experiencing some abd cramping, frequent mucus bm's. Pt stated she would like a call back. Please advise.

## 2021-03-08 NOTE — Addendum Note (Signed)
Addended by: Annett Fabian on: 03/08/2021 03:42 PM   Modules accepted: Orders

## 2021-03-08 NOTE — Telephone Encounter (Signed)
Patient notified of new recommendations  New labs ordered She will come on Monday for labs.

## 2021-03-08 NOTE — Telephone Encounter (Signed)
DOD  Previous notes reviewed.  Previous history of constipation.  History of C. difficile in 2019.  Previous colonoscopies reviewed.  -Stop Linzess or any other laxatives. -Check stool for C. difficile, GI pathogens and calprotectin -Also check CBC, CMP and CRP -Continue Bentyl as needed. -Let us know if still with problems.  RG

## 2021-03-11 ENCOUNTER — Other Ambulatory Visit: Payer: BC Managed Care – PPO

## 2021-03-11 ENCOUNTER — Other Ambulatory Visit (INDEPENDENT_AMBULATORY_CARE_PROVIDER_SITE_OTHER): Payer: BC Managed Care – PPO

## 2021-03-11 DIAGNOSIS — R197 Diarrhea, unspecified: Secondary | ICD-10-CM

## 2021-03-11 DIAGNOSIS — R195 Other fecal abnormalities: Secondary | ICD-10-CM | POA: Diagnosis not present

## 2021-03-11 DIAGNOSIS — R194 Change in bowel habit: Secondary | ICD-10-CM | POA: Diagnosis not present

## 2021-03-11 DIAGNOSIS — R109 Unspecified abdominal pain: Secondary | ICD-10-CM | POA: Diagnosis not present

## 2021-03-11 LAB — CBC WITH DIFFERENTIAL/PLATELET
Basophils Absolute: 0.1 10*3/uL (ref 0.0–0.1)
Basophils Relative: 1.2 % (ref 0.0–3.0)
Eosinophils Absolute: 0.1 10*3/uL (ref 0.0–0.7)
Eosinophils Relative: 2 % (ref 0.0–5.0)
HCT: 41.4 % (ref 36.0–46.0)
Hemoglobin: 14.1 g/dL (ref 12.0–15.0)
Lymphocytes Relative: 29.2 % (ref 12.0–46.0)
Lymphs Abs: 2.1 10*3/uL (ref 0.7–4.0)
MCHC: 34 g/dL (ref 30.0–36.0)
MCV: 85.7 fl (ref 78.0–100.0)
Monocytes Absolute: 0.5 10*3/uL (ref 0.1–1.0)
Monocytes Relative: 7.2 % (ref 3.0–12.0)
Neutro Abs: 4.4 10*3/uL (ref 1.4–7.7)
Neutrophils Relative %: 60.4 % (ref 43.0–77.0)
Platelets: 224 10*3/uL (ref 150.0–400.0)
RBC: 4.84 Mil/uL (ref 3.87–5.11)
RDW: 12.4 % (ref 11.5–15.5)
WBC: 7.3 10*3/uL (ref 4.0–10.5)

## 2021-03-11 LAB — COMPREHENSIVE METABOLIC PANEL
ALT: 14 U/L (ref 0–35)
AST: 14 U/L (ref 0–37)
Albumin: 4.3 g/dL (ref 3.5–5.2)
Alkaline Phosphatase: 40 U/L (ref 39–117)
BUN: 13 mg/dL (ref 6–23)
CO2: 25 mEq/L (ref 19–32)
Calcium: 9 mg/dL (ref 8.4–10.5)
Chloride: 104 mEq/L (ref 96–112)
Creatinine, Ser: 0.75 mg/dL (ref 0.40–1.20)
GFR: 107.05 mL/min (ref >60.00)
Glucose, Bld: 84 mg/dL (ref 70–99)
Potassium: 3.6 mEq/L (ref 3.5–5.1)
Sodium: 137 mEq/L (ref 135–145)
Total Bilirubin: 0.4 mg/dL (ref 0.2–1.2)
Total Protein: 6.7 g/dL (ref 6.0–8.3)

## 2021-03-11 LAB — C-REACTIVE PROTEIN: CRP: <1 mg/dL (ref 0.5–20.0)

## 2021-03-14 LAB — GI PROFILE, STOOL, PCR

## 2021-10-06 DIAGNOSIS — Z8619 Personal history of other infectious and parasitic diseases: Secondary | ICD-10-CM

## 2021-10-06 HISTORY — DX: Personal history of other infectious and parasitic diseases: Z86.19

## 2022-01-27 ENCOUNTER — Telehealth: Payer: Self-pay | Admitting: Gastroenterology

## 2022-01-27 NOTE — Telephone Encounter (Signed)
Inbound call from patient stating that she recently wad proscribed a acne medication and wants to touch base with Dr. Tarri Glenn to see if it is okay for her to take. Patient states that she has history with C- Diff and doesn't know if medication will start that. Please advise.  ?

## 2022-01-27 NOTE — Telephone Encounter (Signed)
Called pt to advise that Dr. Orvan Falconer is out of the office and will not return until tomorrow. Also wanted to clarify name of medication Rx'd. States it is a compound of Tret0.005%, Clindamycin 1% and Azelaic acid 2%, apply topically qpm. Advised I don't believe this will be a concern, however, will forward this message to Dr. Christain Sacramento for her to address upon her return 4/25. Verbalized acceptance and understanding. ?

## 2022-01-28 NOTE — Telephone Encounter (Signed)
Returned pt call. Informed of Dr. Orvan Falconer response. Verbalized acceptance and understanding. ?

## 2022-01-28 NOTE — Telephone Encounter (Signed)
Patient is returning your call.  

## 2022-01-28 NOTE — Telephone Encounter (Signed)
LVM requesting returned call 

## 2022-10-20 DIAGNOSIS — M25511 Pain in right shoulder: Secondary | ICD-10-CM | POA: Diagnosis not present

## 2022-10-29 ENCOUNTER — Ambulatory Visit (INDEPENDENT_AMBULATORY_CARE_PROVIDER_SITE_OTHER): Payer: BC Managed Care – PPO | Admitting: Gastroenterology

## 2022-10-29 ENCOUNTER — Encounter: Payer: Self-pay | Admitting: Gastroenterology

## 2022-10-29 ENCOUNTER — Other Ambulatory Visit: Payer: Self-pay

## 2022-10-29 VITALS — BP 100/64 | HR 80 | Ht 62.0 in | Wt 148.0 lb

## 2022-10-29 DIAGNOSIS — R1011 Right upper quadrant pain: Secondary | ICD-10-CM | POA: Diagnosis not present

## 2022-10-29 NOTE — Progress Notes (Signed)
Referring Provider: No ref. provider found Primary Care Physician:  Pcp, No  Chief complaint:  Recurrent diverticulitis, LLQ pain   IMPRESSION:  Severe RUQ pain 04/2020 and again 11/23 Pancolonic divericulitis with history of recurrent, acute diverticulitis    - Pancolonic diverticulosis on colonoscopy 2018 Alexandra Cortez)    - Severe pancolonic diverticulosis on CT scan 11/17/2019 Chronic constipation with associated bloating C. difficile 2019 History of ibuprofen for headaches, stopped last fall Family history of colon polyps (mother) No known family history of colon cancer  Severe RUQ pain 04/2020 and again 11/23:  Differential is broad.  Must consider symptomatic biliary disease. Also considering Alpha-Gal as symptoms may be triggered by mammalian meat.   Diverticulosis with recurrent diverticulitis: No ongoing symptoms related to diverticulitis.  Using NSAIDs may be associated with a moderately increased risk of occurrence of any episode of diverticulitis and complicated diverticulitis. Would consider repeat colonoscopy to evaluate for segmental colitis associated with diverticulosis if her symptoms persist.   Chronic constipation with associated bloating: Continue fiber supplements, high-fiber diet, plenty of water, and Florastor. Use Linzess to 72 mcg PRN. Add dicyclomine for cramping.    Family history of colon polyps: Start colon cancer screening at age 62.   PLAN: - Continue high fiber diet, drink plenty of fluids - Continue fiber supplements BID - Continue to avoid all NSAIDs - Continue dicyclomine 20 mg QID PRN abdominal pain - Zofran SL 4mg  QID PRN (#50 with no refills) - Alpha Gal testing - Abdominal ultrasound +/- HIDA if the ultrasound is negative - Referral to [facility] to establish a new PCP - Follow-up PRN, at least annually  Please see the "Patient Instructions" section for addition details about the plan.  HPI: Alexandra Cortez is a 32  y.o. female who returns in follow-up for abdominal pain. She was last seen 11/21. The interval history is obtained through the patient and review of her electronic health record. She has a history of kidney stones requiring kidney stones requiring lithotripsy x 2 and recurrent diverticulitis.   Evaluated for abdominal pain, blood and mucousy stools 04/12/18 Colonoscopy 8/18  showed pancolonic diverticulosis Recurrent acute diverticulitis 4/18 (CT), 1/19, 4/19 , 6/19 (CT with descending diverticulitis) with treatment x 3 for ongoing symptoms at that time. Care managed over the phone in 2019 and [name] was concerned about the safety of taking so many antibiotics in such a short period of time. Concerned about antibiotic resistance.  Ultimately had C Dff 04/13/18 treated with vancomycin 125mg  QID x 10 days. Surgery discussed but not planned. Recurrent symptoms 8/19 but no fever, and fever has been a part of her constellation of symptoms with acute diverticulitis Recurrent symptoms in 10/2019 with smouldering LLQ pain in the same location of prior episodes of diverticulitis. No fever. CT 11/17/19 showed no acute diverticulitis but severe pancolonic diverticulosis  She has a history of significant chronic constipation.  Has tried Miralax and Metamucil - provide some initial relief but no long term improvement. Linzess 140 mcg daily results in significant watery diarrhea. Cannot take the medication around work schedule.    Developed LLQ pain and RUQ pain in November. Similar location to prior episodes of diverticulitis. She treated with dicyclomine and NSAIDs. She had two days of diarrhea after Thanksgiving followed by constipation. She went 5 days without a bowel movement. Symptoms resolved after using an enema. Bowel habits have been normal since then. But, she continues to have intermittent RUQ pain. This has more recently progressed to diffuse  abdominal pain.  During these episodes she has a pruritic rash,  however, she has been having the rash even at times that she is not having pain. No urticaria or shortness of breath. No toxin exposures.   Symptoms may be associated with fried food and red meat.   Fiber supplements, stool softeners, and a Fleet enema x 2.  She has been struggling with right shoulder pain. Avoiding NSAIDs that were recommended by the orthopedic surgeon.   Past Medical History:  Diagnosis Date   Frequency of urination    Hematuria    Left flank pain    Left ureteral calculus    Nocturia    Urgency of urination     Past Surgical History:  Procedure Laterality Date   CYSTOSCOPY W/ RETROGRADES  06/14/2012   Procedure: CYSTOSCOPY WITH RETROGRADE PYELOGRAM;  Surgeon: Alexandra Jabs, MD;  Location: [facility];  Service: Urology;  Laterality: Left;   CYSTOSCOPY WITH URETEROSCOPY  06/14/2012   Procedure: CYSTOSCOPY WITH URETEROSCOPY;  Surgeon: Alexandra Jabs, MD;  Location: [facility];  Service: Urology;  Laterality: Left;   EXTRACORPOREAL SHOCK WAVE LITHOTRIPSY  APR 2013   LEFT SIDE--  [facility]    WISDOM TOOTH EXTRACTION  2012    Current Outpatient Medications  Medication Sig Dispense Refill   dicyclomine (BENTYL) 10 MG capsule Take 1 capsule (10 mg total) by mouth 4 (four) times daily -  before meals and at bedtime. 120 capsule 11   FLORASTOR 250 MG capsule TAKE 1 CAPSULE(250 MG) BY MOUTH TWICE DAILY (Patient taking differently: Patient takes 1 capsule daily.) 60 capsule 6   fluticasone (FLONASE) 50 MCG/ACT nasal spray Place 1 spray into both nostrils daily as needed for allergies or rhinitis.     ibuprofen (ADVIL,MOTRIN) 200 MG tablet Take 600-800 mg by mouth every 6 (six) hours as needed for moderate pain.     No current facility-administered medications for this visit.    Allergies as of 10/29/2022   (No Known Allergies)    Family History  Problem Relation Age of Onset   Esophageal cancer Maternal Grandfather    Breast  cancer Paternal Grandmother    Colon cancer Neg Hx    Liver cancer Neg Hx    Pancreatic cancer Neg Hx    Rectal cancer Neg Hx    Stomach cancer Neg Hx     Social History   Socioeconomic History   Marital status: Soil scientist    Spouse name: Environmental health practitioner   Number of children: Not on file   Years of education: Not on file   Highest education level: Not on file  Occupational History   Not on file  Tobacco Use   Smoking status: Former    Packs/day: 0.50    Years: 0.60    Total pack years: 0.30    Types: Cigarettes    Quit date: 10/06/2012    Years since quitting: 10.0   Smokeless tobacco: Never  Vaping Use   Vaping Use: Every day  Substance and Sexual Activity   Alcohol use: Yes    Comment: OCCASIONAL   Drug use: Not Currently   Sexual activity: Yes    Birth control/protection: None  Other Topics Concern   Not on file  Social History Narrative   Not on file   Social Determinants of Health   Financial Resource Strain: Not on file  Food Insecurity: Not on file  Transportation Needs: Not on file  Physical Activity:  Not on file  Stress: Not on file  Social Connections: Not on file  Intimate Partner Violence: Not on file    Physical Exam: General:   Alert,  well-nourished, pleasant and cooperative in NAD Head:  Normocephalic and atraumatic. Eyes:  Sclera clear, no icterus.   Conjunctiva pink. Msk:  Symmetrical. No boney deformities Extremities:  No clubbing or edema. Neurologic:  Alert and  oriented x4;  grossly nonfocal Skin: No obvious rash or bruise. Psych:  Alert and cooperative. Normal mood and affect.     Kore Madlock L. Orvan Falconer, MD, MPH 10/29/2022, 5:11 PM

## 2022-10-29 NOTE — Patient Instructions (Addendum)
It was a pleasure to see you today.  Please stop in the lab. Your provider has requested that you go to the basement level for lab work before leaving today. Press "B" on the elevator. The lab is located at the first door on the left as you exit the elevator.  Follow a high fiber diet or use fiber supplements on a regular basis.  There is no need to avoid seeds, nuts, corn, and berries. But, I would recommend avoiding all foods that cause symptoms.   Non-steroidal antiinflammatory medications (such as ibuprofen, naproxen, etc) may increase your risk for a recurrent of diverticulitis. Please avoid these medications whenever possible.    We discussed testing your gallbladder to be sure this isn't the cause of your pain.   If you develop the rash again, try taking over the counter Claritin and Pepcid to see if it gets better. If it does, we may want you to see another specialist. There are also additional tests we can do it needed.   You have been scheduled for an abdominal ultrasound at [facility] (1st floor of hospital) on Thursday 11/06/22 at 9:30 am. Please arrive 30 minutes prior to your appointment for registration. Make certain not to have anything to eat or drink 6 hours prior to your appointment. Should you need to reschedule your appointment, please contact radiology at [phone]. This test typically takes about 30 minutes to perform.

## 2022-10-30 LAB — ALPHA-GAL PANEL: Allergen, Pork, f26: <0.1 kU/L

## 2022-11-01 LAB — INTERPRETATION:

## 2022-11-01 LAB — ALPHA-GAL PANEL
Allergen, Mutton, f88: <0.1 kU/L
Beef: <0.1 kU/L
CLASS: 0
CLASS: 0
Class: 0
GALACTOSE-ALPHA-1,3-GALACTOSE IGE*: <0.1 kU/L (ref <0.10)

## 2022-11-06 ENCOUNTER — Other Ambulatory Visit (HOSPITAL_COMMUNITY): Payer: Self-pay

## 2022-11-10 ENCOUNTER — Ambulatory Visit (HOSPITAL_COMMUNITY)
Admission: RE | Admit: 2022-11-10 | Discharge: 2022-11-10 | Disposition: A | Discharge status: Home / Self Care | Payer: BC Managed Care – PPO | Source: Ambulatory Visit | Attending: Gastroenterology | Admitting: Gastroenterology

## 2022-11-10 DIAGNOSIS — R1011 Right upper quadrant pain: Secondary | ICD-10-CM | POA: Insufficient documentation

## 2022-11-11 ENCOUNTER — Encounter: Payer: Self-pay | Admitting: Gastroenterology

## 2022-11-11 ENCOUNTER — Other Ambulatory Visit: Payer: Self-pay

## 2022-11-11 DIAGNOSIS — R1011 Right upper quadrant pain: Secondary | ICD-10-CM

## 2022-11-20 ENCOUNTER — Telehealth: Payer: Self-pay

## 2022-11-20 NOTE — Telephone Encounter (Signed)
Mychart message sent to pt to provide her with number to radiology scheduling so she can schedule HIDA.

## 2022-11-20 NOTE — Telephone Encounter (Signed)
-----   Message from April H Pait sent at 11/20/2022 10:35 AM EST -----  ----- Message ----- From: Nicholaus Corolla Sent: 11/20/2022  10:16 AM EST To: April H Pait  2x attempts made .  ----- Message ----- From: Belva Chimes H Sent: 11/11/2022   4:33 PM EST To: Nicholaus Corolla   ----- Message ----- From: Marice Potter, RN Sent: 11/11/2022   4:18 PM EST To: April H Pait; Roosvelt Maser  Pt needs to be scheduled for a HIDA scan, thank you!

## 2022-12-17 ENCOUNTER — Ambulatory Visit (INDEPENDENT_AMBULATORY_CARE_PROVIDER_SITE_OTHER): Payer: BC Managed Care – PPO | Admitting: Gastroenterology

## 2022-12-17 ENCOUNTER — Encounter: Payer: Self-pay | Admitting: Gastroenterology

## 2022-12-17 ENCOUNTER — Other Ambulatory Visit (INDEPENDENT_AMBULATORY_CARE_PROVIDER_SITE_OTHER): Payer: BC Managed Care – PPO

## 2022-12-17 VITALS — BP 98/72 | HR 88 | Ht 61.0 in | Wt 155.0 lb

## 2022-12-17 DIAGNOSIS — K760 Fatty (change of) liver, not elsewhere classified: Secondary | ICD-10-CM

## 2022-12-17 DIAGNOSIS — R932 Abnormal findings on diagnostic imaging of liver and biliary tract: Secondary | ICD-10-CM

## 2022-12-17 LAB — CBC WITH DIFFERENTIAL/PLATELET
Basophils Absolute: 0.1 10*3/uL (ref 0.0–0.1)
Basophils Relative: 1.1 % (ref 0.0–3.0)
Eosinophils Absolute: 0.1 10*3/uL (ref 0.0–0.7)
Eosinophils Relative: 1.4 % (ref 0.0–5.0)
HCT: 42.1 % (ref 36.0–46.0)
Hemoglobin: 14 g/dL (ref 12.0–15.0)
Lymphocytes Relative: 30.7 % (ref 12.0–46.0)
Lymphs Abs: 2.1 10*3/uL (ref 0.7–4.0)
MCHC: 33.3 g/dL (ref 30.0–36.0)
MCV: 85.8 fl (ref 78.0–100.0)
Monocytes Absolute: 0.5 10*3/uL (ref 0.1–1.0)
Monocytes Relative: 6.9 % (ref 3.0–12.0)
Neutro Abs: 4.2 10*3/uL (ref 1.4–7.7)
Neutrophils Relative %: 59.9 % (ref 43.0–77.0)
Platelets: 220 10*3/uL (ref 150.0–400.0)
RBC: 4.9 Mil/uL (ref 3.87–5.11)
RDW: 12.4 % (ref 11.5–15.5)
WBC: 7 10*3/uL (ref 4.0–10.5)

## 2022-12-17 LAB — HEPATIC FUNCTION PANEL
ALT: 15 U/L (ref 0–35)
AST: 13 U/L (ref 0–37)
Albumin: 4.2 g/dL (ref 3.5–5.2)
Alkaline Phosphatase: 41 U/L (ref 39–117)
Bilirubin, Direct: 0.1 mg/dL (ref 0.0–0.3)
Total Bilirubin: 0.3 mg/dL (ref 0.2–1.2)
Total Protein: 6.7 g/dL (ref 6.0–8.3)

## 2022-12-17 MED ORDER — ONDANSETRON HCL 4 MG PO TABS: 4.0000 mg | ORAL_TABLET | Freq: Three times a day (TID) | ORAL | 0 refills | Status: DC | PRN

## 2022-12-17 NOTE — Progress Notes (Signed)
Referring Provider: No ref. provider found Primary Care Physician:  Patient, No Pcp Per  Chief complaint: Fatty liver   IMPRESSION:  Fatty liver on ultrasound with normal liver enzymes in 2022 Severe RUQ pain 04/2020 and again 11/23 Pancolonic divericulitis with history of recurrent, acute diverticulitis    - Pancolonic diverticulosis on colonoscopy 2018 Alexandra Cortez)    - Severe pancolonic diverticulosis on CT scan 11/17/2019 Chronic constipation with associated bloating    - Miralax and Metamucil - provide some initial relief but no long term improvement    - Linzess 140 mcg daily results in significant watery diarrhea    - Will use fiber supplements, stool softeners, and a Fleet enema PRN C. difficile 2019 History of ibuprofen for headaches, stopped last fall Family history of colon polyps (mother) No known family history of colon cancer  Severe RUQ pain 04/2020 and again 11/23:  Recent ultrasound negative. Continue to suspect symptomatic biliary disease.  Fatty liver on ultrasound.  Fatty liver not seen on prior CT scan.  FIB4 0.48 using 2022 labs. Will repeat today. If elevated, consider elastography. Discussed natural history, diagnosis, and treatment of fatty liver.   Diverticulosis with recurrent diverticulitis: No ongoing symptoms related to diverticulitis.  Using NSAIDs may be associated with a moderately increased risk of occurrence of any episode of diverticulitis and complicated diverticulitis. Would consider repeat colonoscopy to evaluate for segmental colitis associated with diverticulosis if her symptoms recur.   Chronic constipation with associated bloating: Continue fiber supplements, high-fiber diet, plenty of water, and Florastor. Use Linzess to 72 mcg PRN. Add dicyclomine for cramping.    Family history of colon polyps: Start colon cancer screening at age 24.   PLAN: - Continue high fiber diet, drink plenty of fluids - Continue fiber supplements BID -  Continue to avoid all NSAIDs - Continue dicyclomine 20 mg QID PRN abdominal pain - Zofran SL '4mg'$  QID PRN (#50 with no refills) - Hepatic function panel, CBC - will calculate a FIB4 and consider elastography if elevated - Reviewed lifestyle modifications in fatty liver (see patient instructions) - HIDA with [facility] to continue evaluation for symptomatic gallbladder disease  Please see the "Patient Instructions" section for addition details about the plan.  HPI: Alexandra Cortez is a 32 y.o. female was last seen for LLQ and RUQ abdominal pain and a history of constipation 10/29/22. She returns today to discuss fatty liver that was seen on her recent ultrasound. The interval history is obtained through the patient and review of her electronic health record. She has a history of kidney stones requiring kidney stones requiring lithotripsy x 2, recurrent diverticulitis, and constipation.   Evaluated for abdominal pain, blood and mucousy stools 04/12/18 Colonoscopy 8/18  showed pancolonic diverticulosis Recurrent acute diverticulitis 4/18 (CT), 1/19, 4/19 , 6/19 (CT with descending diverticulitis) with treatment x 3 for ongoing symptoms at that time. Care managed over the phone in 2019 and [name] was concerned about the safety of taking so many antibiotics in such a short period of time. Concerned about antibiotic resistance.  Ultimately had C Dff 04/13/18 treated with vancomycin '125mg'$  QID x 10 days. Surgery discussed but not planned. Recurrent symptoms 8/19 but no fever, and fever has been a part of her constellation of symptoms with acute diverticulitis Recurrent symptoms in 10/2019 with smouldering LLQ pain in the same location of prior episodes of diverticulitis. No fever. CT 11/17/19 showed no acute diverticulitis but severe pancolonic diverticulosis, normal liver GI pathogen panel negative 6/22 Normal liver enzymes  2022 Alpha gal testing negative 1/24 Abdominal ultrasound 11/30/22 showed fatty liver, no  gallstones HIDA recommended but it has not yet been scheduled.   No prior knowledge of fatty liver.  No identified risk factors for chronic viral hepatitis.  She donated blood a few years ago without difficulty.  Liver enzymes in 2022 were normal.  She reports a strong family history of fatty liver.  Her father also has concurrent alcohol related liver disease.  Past Medical History:  Diagnosis Date   Frequency of urination    Hematuria    Left flank pain    Left ureteral calculus    Nocturia    Urgency of urination     Past Surgical History:  Procedure Laterality Date   CYSTOSCOPY W/ RETROGRADES  06/14/2012   Procedure: CYSTOSCOPY WITH RETROGRADE PYELOGRAM;  Surgeon: Claybon Jabs, MD;  Location: [facility];  Service: Urology;  Laterality: Left;   CYSTOSCOPY WITH URETEROSCOPY  06/14/2012   Procedure: CYSTOSCOPY WITH URETEROSCOPY;  Surgeon: Claybon Jabs, MD;  Location: [facility];  Service: Urology;  Laterality: Left;   EXTRACORPOREAL SHOCK WAVE LITHOTRIPSY  APR 2013   LEFT SIDE--  [facility]    WISDOM TOOTH EXTRACTION  2012    Current Outpatient Medications  Medication Sig Dispense Refill   dicyclomine (BENTYL) 10 MG capsule Take 1 capsule (10 mg total) by mouth 4 (four) times daily -  before meals and at bedtime. 120 capsule 11   FLORASTOR 250 MG capsule TAKE 1 CAPSULE(250 MG) BY MOUTH TWICE DAILY (Patient taking differently: Patient takes 1 capsule daily.) 60 capsule 6   fluticasone (FLONASE) 50 MCG/ACT nasal spray Place 1 spray into both nostrils daily as needed for allergies or rhinitis.     ibuprofen (ADVIL,MOTRIN) 200 MG tablet Take 600-800 mg by mouth every 6 (six) hours as needed for moderate pain.     ondansetron (ZOFRAN) 4 MG tablet Take 1 tablet (4 mg total) by mouth every 8 (eight) hours as needed for nausea or vomiting. 50 tablet 0   No current facility-administered medications for this visit.    Allergies as of 12/17/2022    (No Known Allergies)    Family History  Problem Relation Age of Onset   Esophageal cancer Maternal Grandfather    Breast cancer Paternal Grandmother    Colon cancer Neg Hx    Liver cancer Neg Hx    Pancreatic cancer Neg Hx    Rectal cancer Neg Hx    Stomach cancer Neg Hx      Physical Exam: General:   Alert,  well-nourished, pleasant and cooperative in NAD Head:  Normocephalic and atraumatic. Eyes:  Sclera clear, no icterus.   Conjunctiva pink. Msk:  Symmetrical. No boney deformities Extremities:  No clubbing or edema. Neurologic:  Alert and  oriented x4;  grossly nonfocal Skin: No obvious rash or bruise. Psych:  Alert and cooperative. Normal mood and affect.     Laveda Demedeiros L. Tarri Glenn, MD, MPH 12/17/2022, 10:49 AM

## 2022-12-17 NOTE — Patient Instructions (Addendum)
Continue high fiber diet, drink plenty of fluids  Continue fiber supplements twice daily  Continue to avoid all NSAIDs  Continue dicyclomine 20 mg 4 times daily as needed for abdominal pain  Go to the lab today for additional testing of your liver enzymes  I have recommended the HIDA scan to further evaluate for symptomatic gallbladder disease  Your recent ultrasound suggest the possibility of fatty liver.  Unfortunately, there are no FDA-approved medications for fatty liver disease.   The good news is that the most effective treatment so far for fatty liver disease does not involve medications, but rather lifestyle changes. The bad news is that these are typically hard to achieve and maintain for many people. Here's what we know helps:  - Lose weight. Weight loss of roughly 5% of your body weight might be enough to improve abnormal liver tests and decrease the fat in the liver. Losing between 7% and 10% of body weight seems to decrease the amount of inflammation and injury to liver cells, and it may even reverse some of the damage of fibrosis. Target a gradual weight loss of 1 to 2 pounds per week, as very rapid weight loss may worsen inflammation and fibrosis. You may want to explore the option of weight loss surgery with your doctor, if you aren't making any headway with weight loss and your health is suffering. - It appears that aerobic exercise also leads to decreased fat in the liver, and with vigorous intensity, possibly also decreased inflammation independent of weight loss. - Eat well. Some studies suggest that the Mediterranean diet may also decrease the fat in the liver. This nutrition plan emphasizes fruits, vegetables, whole grains, legumes, nuts, replacing butter with olive or canola oil, limiting red meat, and eating more fish and lean poultry.  We particularly discussed avoiding high fructose corn syrup. - Drink coffee, maybe? Some studies showed that patients with NAFLD who drank  coffee (about two cups every day) had a decreased risk in fibrosis. However, take into consideration the downsides of regular caffeine intake.  Even though it can be difficult to make these lifestyle changes and lose the weight, the benefit is immense if you have fatty liver, so give it your best effort! The greatest risk for people with a fatty liver is cardiovascular disease. Not only can some of these lifestyle changes improve or resolve your fatty liver, they will also help keep your heart healthy.

## 2022-12-22 ENCOUNTER — Encounter: Payer: Self-pay | Admitting: Gastroenterology

## 2022-12-22 ENCOUNTER — Ambulatory Visit: Payer: BC Managed Care – PPO | Admitting: Physician Assistant

## 2022-12-22 ENCOUNTER — Encounter: Payer: Self-pay | Admitting: Physician Assistant

## 2022-12-22 VITALS — BP 100/70 | HR 76 | Temp 98.0°F | Ht 61.0 in | Wt 151.2 lb

## 2022-12-22 DIAGNOSIS — G43009 Migraine without aura, not intractable, without status migrainosus: Secondary | ICD-10-CM | POA: Insufficient documentation

## 2022-12-22 DIAGNOSIS — R590 Localized enlarged lymph nodes: Secondary | ICD-10-CM | POA: Diagnosis not present

## 2022-12-22 DIAGNOSIS — K5900 Constipation, unspecified: Secondary | ICD-10-CM | POA: Insufficient documentation

## 2022-12-22 DIAGNOSIS — J358 Other chronic diseases of tonsils and adenoids: Secondary | ICD-10-CM | POA: Insufficient documentation

## 2022-12-22 DIAGNOSIS — Z124 Encounter for screening for malignant neoplasm of cervix: Secondary | ICD-10-CM | POA: Diagnosis not present

## 2022-12-22 DIAGNOSIS — J301 Allergic rhinitis due to pollen: Secondary | ICD-10-CM | POA: Insufficient documentation

## 2022-12-22 DIAGNOSIS — K5730 Diverticulosis of large intestine without perforation or abscess without bleeding: Secondary | ICD-10-CM | POA: Diagnosis not present

## 2022-12-22 DIAGNOSIS — K219 Gastro-esophageal reflux disease without esophagitis: Secondary | ICD-10-CM

## 2022-12-22 MED ORDER — DICYCLOMINE HCL 10 MG PO CAPS: 10.0000 mg | ORAL_CAPSULE | Freq: Three times a day (TID) | ORAL | 11 refills | Status: DC

## 2022-12-22 NOTE — Progress Notes (Signed)
Subjective:    Alexandra Cortez is a 32 y.o. female and is here for new patient visit.  HPI  Health Maintenance Due  Topic Date Due   HIV Screening  Never done   Hepatitis C Screening  Never done   PAP SMEAR-Modifier  02/20/2019    Acute Concerns: None.  Chronic Issues: Diverticulosis:  She was first diagnosed in 2015.  She reports experiencing gas, abdominal pain, constipation, diarrhea, and frequent burping during episodes.  She was prescribed antibiotics for recurrent diverticulosis infections . She reports her episodes of diverticulosis have since decreased.  She notes her latest flare up being in 08/2022.  Sees [facility].  Heartburn:  She tends to avoid acidic foods like tomatoes, orange juice, and garlic. Takes OTC omeprazole as needed. This is effective for her when she takes this.   Lymphadenopathy:  She reports having an ultrasound done in 2013 for enlarged lymph nodes in her left inguinal region. She states that she had biopsy, results below  Lymph node, needle/core biopsy, Right anterior thigh superficial - LIMITED LYMPHOID TISSUE, SEE COMMENT. Microscopic Comment The lymphoid tissue is extremely limited and consists of a small fragment, mostly composed of small lymphoid cells, likely representing lymph node paracortex. The appearance is nonspecific but the tissue is very limited and not considered diagnostic. Excisional biopsy is recommended if clinically indicated.  She reports that she did not have any additional biopsy as it was never recommended to her. She states that the groin LAD has resolved.  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 Comes and goes but always has a feeling of a significant node. Denies weight loss, fevers, chills, night sweats, hemoptysis.  Alcohol use:  reports current alcohol use.  Tobacco use:  Tobacco Use: High Risk (12/22/2022)   Patient History    Smoking Tobacco Use: Every Day    Smokeless  Tobacco Use: Never    Passive Exposure: Not on file   Eligible for lung cancer screening? no     12/22/2022    2:07 PM  Depression screen PHQ 2/9  Decreased Interest 0  Down, Depressed, Hopeless 0  PHQ - 2 Score 0     Other providers/specialists: Patient Care Team: Inda Coke, [place] as PCP - General (Physician Assistant)    PMHx, SurgHx, SocialHx, Medications, and Allergies were reviewed in the Visit Navigator and updated as appropriate.   Past Medical History:  Diagnosis Date   Allergy    GERD (gastroesophageal reflux disease)    Hematuria    History of shingles 2023   Left flank pain    Left ureteral calculus    Urgency of urination      Past Surgical History:  Procedure Laterality Date   CYSTOSCOPY W/ RETROGRADES  06/14/2012   Procedure: CYSTOSCOPY WITH RETROGRADE PYELOGRAM;  Surgeon: Claybon Jabs, MD;  Location: [facility];  Service: Urology;  Laterality: Left;   CYSTOSCOPY WITH URETEROSCOPY  06/14/2012   Procedure: CYSTOSCOPY WITH URETEROSCOPY;  Surgeon: Claybon Jabs, MD;  Location: [facility];  Service: Urology;  Laterality: Left;   EXTRACORPOREAL SHOCK WAVE LITHOTRIPSY  01/05/2012   LEFT SIDE--  [facility]    TONSILLECTOMY  2018   WISDOM TOOTH EXTRACTION  10/06/2010     Family History  Problem Relation Age of Onset   Diverticulitis Mother    Hypertension Father    Arthritis Father    Alcohol abuse Father    Drug abuse Brother  Stroke Maternal Grandmother    Diabetes Maternal Grandmother    Obesity Maternal Grandmother    Esophageal cancer Maternal Grandfather    Breast cancer Paternal Grandmother 29   Colon cancer Neg Hx    Liver cancer Neg Hx    Pancreatic cancer Neg Hx    Rectal cancer Neg Hx    Stomach cancer Neg Hx     Social History   Tobacco Use   Smoking status: Every Day    Packs/day: 0.50    Years: 0.60    Additional pack years: 0.00    Total pack years: 0.30    Types:  Cigarettes, E-cigarettes    Last attempt to quit: 10/06/2012    Years since quitting: 10.2   Smokeless tobacco: Never  Vaping Use   Vaping Use: Every day  Substance Use Topics   Alcohol use: Yes    Comment: OCCASIONAL   Drug use: Not Currently    Types: Marijuana    Comment: used in the past    Review of Systems:   Review of Systems  Constitutional:  Negative for chills, fever, malaise/fatigue and weight loss.  HENT:  Negative for hearing loss, sinus pain and sore throat.   Respiratory:  Negative for cough and hemoptysis.   Cardiovascular:  Negative for chest pain, palpitations, leg swelling and PND.  Gastrointestinal:  Negative for abdominal pain, constipation, diarrhea, heartburn, nausea and vomiting.  Genitourinary:  Negative for dysuria, frequency and urgency.  Musculoskeletal:  Negative for back pain, myalgias and neck pain.  Skin:  Negative for itching and rash.  Neurological:  Negative for dizziness, tingling, seizures and headaches.  Endo/Heme/Allergies:  Negative for polydipsia.  Psychiatric/Behavioral:  Negative for depression. The patient is not nervous/anxious.     Objective:   BP 100/70 (BP Location: Left Arm, Patient Position: Sitting, Cuff Size: Normal)   Pulse 76   Temp 98 F (36.7 C) (Temporal)   Ht 5\' 1"  (1.549 m)   Wt 151 lb 4 oz (68.6 kg)   LMP 11/29/2022 (Exact Date)   SpO2 99%   BMI 28.58 kg/m  Body mass index is 28.58 kg/m.   General Appearance:    Alert, cooperative, no distress, appears stated age  Head:    Normocephalic, without obvious abnormality, atraumatic  Eyes:    PERRL, conjunctiva/corneas clear, EOM's intact, fundi    benign, both eyes  Ears:    Normal TM's and external ear canals, both ears  Nose:   Nares normal, septum midline, mucosa normal, no drainage    or sinus tenderness  Throat:   Lips, mucosa, and tongue normal; teeth and gums normal  Neck:   Supple, symmetrical, trachea midline, no adenopathy;    thyroid:  no  enlargement/tenderness/nodules; no carotid   bruit or JVD  Back:     Symmetric, no curvature, ROM normal, no CVA tenderness  Lungs:     Clear to auscultation bilaterally, respirations unlabored  Chest Wall:    No tenderness or deformity   Heart:    Regular rate and rhythm, S1 and S2 normal, no murmur, rub or gallop  Breast Exam:    Deferred  Abdomen:     Soft, non-tender, bowel sounds active all four quadrants,    no masses, no organomegaly  Genitalia:    Deferred   Extremities:   Extremities normal, atraumatic, no cyanosis or edema  Pulses:   2+ and symmetric all extremities  Skin:   Skin color, texture, turgor normal, no rashes or lesions  Lymph nodes:   Cervical, supraclavicular, and axillary nodes normal  Neurologic:   CNII-XII intact, normal strength, sensation and reflexes    throughout    Assessment/Plan:   Diverticulosis of colon Reviewed history and most recent GI note Continue miralax prn, probiotics and continued GI follow-up  Gastroesophageal reflux disease, unspecified whether esophagitis present Well controlled with avoidance of citris foods and prn omeprazole If lack of improvement, will refer back to GI  Pap smear for cervical cancer screening Refer to gynecology  Cervical lymphadenopathy Given persistence, will obtain u/s  I,Rachel Rivera,acting as a scribe for [employer], PA.,have documented all relevant documentation on the behalf of Inda Coke, PA,as directed by  Inda Coke, PA while in the presence of Inda Coke, [place].  I, Inda Coke, [place], have reviewed all documentation for this visit. The documentation on 12/22/22 for the exam, diagnosis, procedures, and orders are all accurate and complete.  I spent a total of 52 minutes on this visit, today 12/22/22, which included reviewing previous notes from gastro on 12/17/22 and u/s results from 2013, ordering tests, discussing plan of care with patient and using shared-decision making on next  steps, refilling medications, and documenting the findings in the note.  Inda Coke, PA-C [place]

## 2022-12-22 NOTE — Patient Instructions (Signed)
It was great to see you!  We are going to refer to gynecology  Update CMP today  We will order ultrasound of your neck for further evaluation  Let's follow-up in 6 months for physical, sooner if you have concerns.  Take care,  Inda Coke PA-C

## 2022-12-22 NOTE — Addendum Note (Signed)
Addended by: Horris Latino on: 12/22/2022 03:16 PM   Modules accepted: Orders

## 2022-12-23 LAB — COMPREHENSIVE METABOLIC PANEL
ALT: 12 U/L (ref 0–35)
AST: 14 U/L (ref 0–37)
Albumin: 4.2 g/dL (ref 3.5–5.2)
Alkaline Phosphatase: 44 U/L (ref 39–117)
BUN: 11 mg/dL (ref 6–23)
CO2: 25 mEq/L (ref 19–32)
Calcium: 9.2 mg/dL (ref 8.4–10.5)
Chloride: 107 mEq/L (ref 96–112)
Creatinine, Ser: 0.69 mg/dL (ref 0.40–1.20)
GFR: 115.24 mL/min (ref >60.00)
Glucose, Bld: 88 mg/dL (ref 70–99)
Potassium: 4.1 mEq/L (ref 3.5–5.1)
Sodium: 140 mEq/L (ref 135–145)
Total Bilirubin: 0.4 mg/dL (ref 0.2–1.2)
Total Protein: 6.5 g/dL (ref 6.0–8.3)

## 2023-01-05 ENCOUNTER — Encounter: Payer: Self-pay | Admitting: Physician Assistant

## 2023-05-05 ENCOUNTER — Ambulatory Visit: Payer: 59 | Admitting: Nurse Practitioner

## 2023-05-05 ENCOUNTER — Encounter: Payer: Self-pay | Admitting: Nurse Practitioner

## 2023-05-05 ENCOUNTER — Other Ambulatory Visit (INDEPENDENT_AMBULATORY_CARE_PROVIDER_SITE_OTHER): Payer: 59

## 2023-05-05 VITALS — BP 98/60 | HR 86 | Ht 62.0 in | Wt 152.4 lb

## 2023-05-05 DIAGNOSIS — K219 Gastro-esophageal reflux disease without esophagitis: Secondary | ICD-10-CM

## 2023-05-05 DIAGNOSIS — K760 Fatty (change of) liver, not elsewhere classified: Secondary | ICD-10-CM | POA: Diagnosis not present

## 2023-05-05 LAB — HEPATIC FUNCTION PANEL
ALT: 15 U/L (ref 0–35)
AST: 14 U/L (ref 0–37)
Albumin: 4.5 g/dL (ref 3.5–5.2)
Alkaline Phosphatase: 44 U/L (ref 39–117)
Bilirubin, Direct: 0.1 mg/dL (ref 0.0–0.3)
Total Bilirubin: 0.4 mg/dL (ref 0.2–1.2)
Total Protein: 6.8 g/dL (ref 6.0–8.3)

## 2023-05-05 NOTE — Patient Instructions (Addendum)
Your provider has requested that you go to the basement level for lab work before leaving today. Press "B" on the elevator. The lab is located at the first door on the left as you exit the elevator. Reduce carbohydrated in diet: pasta, bread, potatoes, etc.  Exercise as tolerated.  Follow up in 1 year or as needed.  Weight loss is recommended to reduce risk of fatty liver disease. (5-10 lbs)  Due to recent changes in healthcare laws, you may see the results of your imaging and laboratory studies on MyChart before your provider has had a chance to review them.  We understand that in some cases there may be results that are confusing or concerning to you. Not all laboratory results come back in the same time frame and the provider may be waiting for multiple results in order to interpret others.  Please give [place] 48 hours in order for your provider to thoroughly review all the results before contacting the office for clarification of your results.   Thank you for trusting me with your gastrointestinal care!   Alcide Evener, CRNP

## 2023-05-05 NOTE — Progress Notes (Signed)
Addendum: Reviewed and agree with assessment and management plan. Pyrtle, Jay M, MD  

## 2023-05-05 NOTE — Progress Notes (Signed)
05/05/2023 Alexandra Cortez [id number] Nov 09, 1990   Chief Complaint: Follow up fatty liver  History of Present Illness: Alexandra Cortez is a 32 year old female with a history of kidney stones, hepatic steatosis, GERD, constipation, C. difficile 2019 and recurrent diverticulitis (01/2017, 10/2017, 01/2018 , 03/2018). Previously followed by Dr. Orvan Falconer. She presents today for for follow up regarding hepatic steatosis identified per RUQ sonogram 02/2023. She has normal LFTs.  Her weight remains stable at 152 pounds with a BMI 27.87.  She does not exercise.  Father with history of liver disease associated with hepatic steatosis and alcohol use.  Infrequent alcohol use.  She previously had RUQ pain which has not recurred since she last saw Dr. Orvan Falconer in office 12/2022.  During that office visit, a CCK HIDA scan was recommended has not been done but is scheduled within the next 2 weeks.  The patient questions if she can cancel the CCK HIDA scan at this point.  She denies having any nausea or vomiting.  No upper or lower abdominal pain.  She has intermittent heartburn which is triggered by eating garlic, tomato or citrus products.  She takes Omeprazole 20 mg two to three days monthly with symptom relief.  No dysphagia.  She denies ever having an EGD.  She underwent a colonoscopy 05/2017 by Dr. Marca Ancona which showed pancolonic diverticulosis.  She was previously advised by Dr. Orvan Falconer to undergo a screening colonoscopy at the age of 32.  Mother with history of colon polyps.  No known family history of colon cancer.  She is no longer taking Linzess as her constipation abated.  No rectal bleeding or black stools.  No recent episodes of diverticulitis.     Latest Ref Rng & Units 12/17/2022   10:49 AM 03/11/2021   11:49 AM 03/26/2018    1:34 PM  CBC  WBC 4.0 - 10.5 K/uL 7.0  7.3  8.7   Hemoglobin 12.0 - 15.0 g/dL 40.9  81.1  91.4   Hematocrit 36.0 - 46.0 % 42.1  41.4  42.8   Platelets 150.0 - 400.0 K/uL 220.0   224.0  197        Latest Ref Rng & Units 12/22/2022    2:49 PM 12/17/2022   10:49 AM 03/11/2021   11:49 AM  CMP  Glucose 70 - 99 mg/dL 88   84   BUN 6 - 23 mg/dL 11   13   Creatinine 7.82 - 1.20 mg/dL 9.56   2.13   Sodium 086 - 145 mEq/L 140   137   Potassium 3.5 - 5.1 mEq/L 4.1   3.6   Chloride 96 - 112 mEq/L 107   104   CO2 19 - 32 mEq/L 25   25   Calcium 8.4 - 10.5 mg/dL 9.2   9.0   Total Protein 6.0 - 8.3 g/dL 6.5  6.7  6.7   Total Bilirubin 0.2 - 1.2 mg/dL 0.4  0.3  0.4   Alkaline Phos 39 - 117 U/L 44  41  40   AST 0 - 37 U/L 14  13  14    ALT 0 - 35 U/L 12  15  14       PRIOR GI WORK UP:  Evaluated for abdominal pain, blood and mucousy stools 04/12/18 Colonoscopy 8/18  showed pancolonic diverticulosis Recurrent acute diverticulitis 4/18 (CT), 1/19, 4/19 , 6/19 (CT with descending diverticulitis) with treatment x 3 for ongoing symptoms at that time. Care managed over the phone in 2019 and [place]  was concerned about the safety of taking so many antibiotics in such a short period of time. Concerned about antibiotic resistance.  Ultimately had C Dff 04/13/18 treated with vancomycin 125mg  QID x 10 days. Surgery discussed but not planned. Recurrent symptoms 8/19 but no fever, and fever has been a part of her constellation of symptoms with acute diverticulitis Recurrent symptoms in 10/2019 with smouldering LLQ pain in the same location of prior episodes of diverticulitis. No fever. CT 11/17/19 showed no acute diverticulitis but severe pancolonic diverticulosis, normal liver GI pathogen panel negative 6/22 Normal liver enzymes 2022 Alpha gal testing negative 1/24 Abdominal ultrasound 11/30/22 showed fatty liver, no gallstones  Current Outpatient Medications on File Prior to Visit  Medication Sig Dispense Refill   dicyclomine (BENTYL) 10 MG capsule Take 1 capsule (10 mg total) by mouth 4 (four) times daily -  before meals and at bedtime. 120 capsule 11   FLORASTOR 250 MG capsule TAKE 1  CAPSULE(250 MG) BY MOUTH TWICE DAILY (Patient taking differently: Take 250 mg by mouth daily in the afternoon. Patient takes 1 capsule daily.) 60 capsule 6   fluticasone (FLONASE) 50 MCG/ACT nasal spray Place 1 spray into both nostrils daily as needed for allergies or rhinitis.     ibuprofen (ADVIL,MOTRIN) 200 MG tablet Take 600-800 mg by mouth every 6 (six) hours as needed for moderate pain.     ondansetron (ZOFRAN) 4 MG tablet Take 1 tablet (4 mg total) by mouth every 8 (eight) hours as needed for nausea or vomiting. 50 tablet 0   OVER THE COUNTER MEDICATION Bee Pollen & Royal Jelly Supplement 1000 mg 2 tablets by mouth daily.     No current facility-administered medications on file prior to visit.   No Known Allergies  Current Medications, Allergies, Past Medical History, Past Surgical History, Family History and Social History were reviewed in [employer] record.  Review of Systems:   Constitutional: Negative for fever, sweats, chills or weight loss.  Respiratory: Negative for shortness of breath.   Cardiovascular: Negative for chest pain, palpitations and leg swelling.  Gastrointestinal: See HPI.  Musculoskeletal: Negative for back pain or muscle aches.  Neurological: Negative for dizziness, headaches or paresthesias.   Physical Exam: BP 98/60   Pulse 86   Ht 5\' 2"  (1.575 m)   Wt 152 lb 6 oz (69.1 kg)   BMI 27.87 kg/m   Wt Readings from Last 3 Encounters:  05/05/23 152 lb 6 oz (69.1 kg)  12/22/22 151 lb 4 oz (68.6 kg)  12/17/22 155 lb (70.3 kg)    General: 32 year old female in no acute distress. Head: Normocephalic and atraumatic. Eyes: No scleral icterus. Conjunctiva pink . Ears: Normal auditory acuity. Mouth: Dentition intact. No ulcers or lesions.  Lungs: Clear throughout to auscultation. Heart: Regular rate and rhythm, no murmur. Abdomen: Soft, nontender and nondistended. No masses or hepatomegaly. Normal bowel sounds x 4 quadrants.  Rectal:  Deferred.  Musculoskeletal: Symmetrical with no gross deformities. Extremities: No edema. Neurological: Alert oriented x 4. No focal deficits.  Psychological: Alert and cooperative. Normal mood and affect  Assessment and Recommendations:  32 year old female with evidence of hepatic steatosis per RUQ sonogram 02/2023.  Normal LFTs.  Father with history of hepatic steatosis. -Patient advised to reduce carbohydrates in diet i.e. reduce bread, pasta, potatoes, rice and sweets, exercise as tolerated and lose 5 to 10 pounds to reduce the risk of developing fatty liver disease -Check hepatic panel today, if normal, recommend repeat hepatic panel  with PCP in 6 months then annually  -Recommend abdominal sonogram elastography if weight loss not achieved and/or if patient develops elevated LFTs  Prior history of recurrent RUQ pain, resolved. No further RUQ pain since 12/2022.  -Cancel CCK HIDA scan -Patient to contact office if RUQ pain recurs -Recommend EGD  and CCK HIDA scan if RUQ pain recurs  GERD -Omeprazole 20mg  po every day -Avoid spicy and acidic foods -Weight loss as recommended above -EGD if reflux symptoms persist or worsen  History of pandiverticulosis and recurrent diverticulitis.  Last episode of diverticulitis confirmed by CT was 03/2018. Recurrent LLQ pain 10/2019, CT 11/17/2019 negative for diverticulitis. Colonoscopy by Dr. Pati Gallo in 2018 showed pan colonic diverticulosis, no polyps. -Patient to contact our office if lower abdominal pain/diverticulitis symptoms recur -Avoid constipation -MiraLAX nightly.  Colon cancer screening.  Mother with history of colon polyps. -Screening colonoscopy at age 44

## 2023-05-25 ENCOUNTER — Other Ambulatory Visit (HOSPITAL_COMMUNITY): Payer: BC Managed Care – PPO

## 2023-06-18 ENCOUNTER — Emergency Department (HOSPITAL_BASED_OUTPATIENT_CLINIC_OR_DEPARTMENT_OTHER)
Admission: EM | Admit: 2023-06-18 | Discharge: 2023-06-19 | Disposition: A | Discharge status: Home / Self Care | Payer: 59 | Attending: Emergency Medicine | Admitting: Emergency Medicine

## 2023-06-18 ENCOUNTER — Other Ambulatory Visit: Payer: Self-pay

## 2023-06-18 ENCOUNTER — Encounter (HOSPITAL_BASED_OUTPATIENT_CLINIC_OR_DEPARTMENT_OTHER): Payer: Self-pay

## 2023-06-18 DIAGNOSIS — R1032 Left lower quadrant pain: Secondary | ICD-10-CM | POA: Diagnosis present

## 2023-06-18 HISTORY — DX: Diverticulitis of intestine, part unspecified, without perforation or abscess without bleeding: K57.92

## 2023-06-18 LAB — COMPREHENSIVE METABOLIC PANEL
ALT: 17 U/L (ref 0–44)
AST: 15 U/L (ref 15–41)
Albumin: 4.4 g/dL (ref 3.5–5.0)
Alkaline Phosphatase: 38 U/L (ref 38–126)
Anion gap: 12 (ref 5–15)
BUN: 14 mg/dL (ref 6–20)
CO2: 23 mmol/L (ref 22–32)
Calcium: 8.9 mg/dL (ref 8.9–10.3)
Chloride: 103 mmol/L (ref 98–111)
Creatinine, Ser: 0.72 mg/dL (ref 0.44–1.00)
GFR, Estimated: >60 mL/min (ref >60)
Glucose, Bld: 87 mg/dL (ref 70–99)
Potassium: 4.5 mmol/L (ref 3.5–5.1)
Sodium: 138 mmol/L (ref 135–145)
Total Bilirubin: 0.3 mg/dL (ref 0.3–1.2)
Total Protein: 6.8 g/dL (ref 6.5–8.1)

## 2023-06-18 LAB — URINALYSIS, ROUTINE W REFLEX MICROSCOPIC
Bilirubin Urine: NEGATIVE
Glucose, UA: NEGATIVE mg/dL
Hgb urine dipstick: NEGATIVE
Leukocytes,Ua: NEGATIVE
Nitrite: NEGATIVE
Protein, ur: NEGATIVE mg/dL
Specific Gravity, Urine: 1.01 (ref 1.005–1.030)
pH: 5.5 (ref 5.0–8.0)

## 2023-06-18 LAB — CBC
HCT: 40.9 % (ref 36.0–46.0)
Hemoglobin: 13.6 g/dL (ref 12.0–15.0)
MCH: 28.9 pg (ref 26.0–34.0)
MCHC: 33.3 g/dL (ref 30.0–36.0)
MCV: 87 fL (ref 80.0–100.0)
Platelets: 237 10*3/uL (ref 150–400)
RBC: 4.7 MIL/uL (ref 3.87–5.11)
RDW: 11.9 % (ref 11.5–15.5)
WBC: 8.1 10*3/uL (ref 4.0–10.5)
nRBC: 0 % (ref 0.0–0.2)

## 2023-06-18 LAB — LIPASE, BLOOD: Lipase: 32 U/L (ref 11–51)

## 2023-06-18 LAB — PREGNANCY, URINE: Preg Test, Ur: NEGATIVE

## 2023-06-18 MED ORDER — ONDANSETRON 4 MG PO TBDP
4.0000 mg | ORAL_TABLET | Freq: Once | ORAL | Status: AC | PRN
  Administered 2023-06-19: 4 mg via ORAL
  Filled 2023-06-18 (×2): qty 1

## 2023-06-18 NOTE — ED Triage Notes (Addendum)
Pt presents with LLQ abd pain, nausea, diarrhea with frank blood x 1 week. Pt reports significant hx of diverticulitis and C-Diff. Pt's GI is unable to get her in within the month.

## 2023-06-19 ENCOUNTER — Emergency Department (HOSPITAL_BASED_OUTPATIENT_CLINIC_OR_DEPARTMENT_OTHER): Payer: 59

## 2023-06-19 MED ORDER — HYOSCYAMINE SULFATE 0.125 MG SL SUBL
0.2500 mg | SUBLINGUAL_TABLET | Freq: Once | SUBLINGUAL | Status: AC
  Administered 2023-06-19: 0.25 mg via SUBLINGUAL
  Filled 2023-06-19: qty 2

## 2023-06-19 MED ORDER — SODIUM CHLORIDE 0.9 % IV BOLUS
1000.0000 mL | Freq: Once | INTRAVENOUS | Status: AC
  Administered 2023-06-19: 1000 mL via INTRAVENOUS

## 2023-06-19 MED ORDER — IOHEXOL 300 MG/ML  SOLN
100.0000 mL | Freq: Once | INTRAMUSCULAR | Status: AC | PRN
  Administered 2023-06-19: 85 mL via INTRAVENOUS

## 2023-06-19 MED ORDER — ACETAMINOPHEN 500 MG PO TABS
1000.0000 mg | ORAL_TABLET | Freq: Once | ORAL | Status: AC
  Administered 2023-06-19: 1000 mg via ORAL
  Filled 2023-06-19: qty 2

## 2023-06-19 NOTE — ED Notes (Signed)
Discharge instructions discussed with pt. Pt verbalized understanding. Pt stable and ambulatory.  °

## 2023-06-19 NOTE — ED Provider Notes (Signed)
EMERGENCY DEPARTMENT AT [facility] Provider Note  CSN: [id number] Arrival date & time: 06/18/23 2116  Chief Complaint(s) Abdominal Pain  HPI Alexandra Cortez is a 32 y.o. 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  Patient did report having several days of diarrhea which have since improved.   Patient does report nausea without emesis.  Denies any urinary symptoms.  She is in a FwF relationship.   The history is provided by the patient.    Past Medical History Past Medical History:  Diagnosis Date   Allergy    Diverticulitis    GERD (gastroesophageal reflux disease)    Hematuria    History of shingles 2023   Left flank pain    Left ureteral calculus    Urgency of urination    Patient Active Problem List   Diagnosis Date Noted   Constipation 12/22/2022   Nonintractable migraine, unspecified migraine type 12/22/2022   Seasonal allergic rhinitis due to pollen 12/22/2022   Clostridium difficile colitis 12/09/2018   Diverticulosis of colon 12/09/2018   Kidney calculus 12/09/2018   Ureteral calculus, left 06/14/2012   Home Medication(s) Prior to Admission medications   Medication Sig Start Date End Date Taking? Authorizing Provider  dicyclomine (BENTYL) 10 MG capsule Take 1 capsule (10 mg total) by mouth 4 (four) times daily -  before meals and at bedtime. 12/22/22   Tressia Danas, MD  FLORASTOR 250 MG capsule TAKE 1 CAPSULE(250 MG) BY MOUTH TWICE DAILY Patient taking differently: Take 250 mg by mouth daily in the afternoon. Patient takes 1 capsule daily. 08/12/20   Tressia Danas, MD  fluticasone (FLONASE) 50 MCG/ACT nasal spray Place 1 spray into both nostrils daily as needed for allergies or rhinitis.    [provider]  ibuprofen (ADVIL,MOTRIN) 200 MG tablet Take 600-800 mg by mouth every 6 (six) hours as needed for moderate pain.    [provider]  ondansetron (ZOFRAN) 4 MG tablet Take 1 tablet (4 mg total) by mouth every 8 (eight) hours as needed for nausea or vomiting. 12/17/22   Tressia Danas, MD  OVER THE COUNTER MEDICATION Bee Pollen & Royal Jelly Supplement 1000 mg 2 tablets by mouth daily.    [provider]                                                                                                                                    Allergies Patient has no known allergies.  Review of Systems Review of Systems As noted in HPI  Physical Exam Vital Signs  I have reviewed the triage vital signs BP 109/67 (BP Location: Right Arm)   Pulse 77   Temp 97.6 F (36.4 C) (Oral)   Resp 16  Ht 5\' 1"  (1.549 m)   Wt 68 kg   LMP 06/04/2023   SpO2 100%   BMI 28.34 kg/m   Physical Exam Vitals reviewed.  Constitutional:      General: She is not in acute distress.    Appearance: She is well-developed. She is not diaphoretic.  HENT:     Head: Normocephalic and atraumatic.     Right Ear: External ear normal.     Left Ear: External ear normal.     Nose: Nose normal.  Eyes:     General: No scleral icterus.    Conjunctiva/sclera: Conjunctivae normal.  Neck:     Trachea: Phonation normal.  Cardiovascular:     Rate and Rhythm: Normal rate and regular rhythm.  Pulmonary:     Effort: Pulmonary effort is normal. No respiratory distress.     Breath sounds: No stridor.  Abdominal:     General: There is no distension.     Tenderness: There is abdominal tenderness in the left lower quadrant.  Musculoskeletal:        General: Normal range of motion.     Cervical back: Normal range of motion.  Neurological:     Mental Status: She is alert and oriented to person, place, and time.  Psychiatric:        Behavior: Behavior normal.     ED Results and  Treatments Labs (all labs ordered are listed, but only abnormal results are displayed) Labs Reviewed  URINALYSIS, ROUTINE W REFLEX MICROSCOPIC - Abnormal; Notable for the following components:      Result Value   Color, Urine COLORLESS (*)    Ketones, ur TRACE (*)    All other components within normal limits  LIPASE, BLOOD  COMPREHENSIVE METABOLIC PANEL  CBC  PREGNANCY, URINE                                                                                                                         EKG  EKG Interpretation Date/Time:    Ventricular Rate:    PR Interval:    QRS Duration:    QT Interval:    QTC Calculation:   R Axis:      Text Interpretation:         Radiology CT ABDOMEN PELVIS W CONTRAST  Result Date: 06/19/2023 CLINICAL DATA:  Left lower quadrant abdominal pain EXAM: CT ABDOMEN AND PELVIS WITH CONTRAST TECHNIQUE: Multidetector CT imaging of the abdomen and pelvis was performed using the standard protocol following bolus administration of intravenous contrast. RADIATION DOSE REDUCTION: This exam was performed according to the departmental dose-optimization program which includes automated exposure control, adjustment of the mA and/or kV according to patient size and/or use of iterative reconstruction technique. CONTRAST:  85mL OMNIPAQUE IOHEXOL 300 MG/ML  SOLN COMPARISON:  CT abdomen and pelvis 11/16/2019 FINDINGS: Lower chest: No acute abnormality. Hepatobiliary: No focal liver abnormality is seen. No gallstones, gallbladder wall thickening, or biliary dilatation. Pancreas: Unremarkable. No pancreatic ductal dilatation or surrounding  inflammatory changes. Spleen: Normal in size without focal abnormality. Adrenals/Urinary Tract: Adrenal glands are unremarkable. Kidneys are normal, without renal calculi, focal lesion, or hydronephrosis. Bladder is unremarkable. Stomach/Bowel: Stomach is within normal limits. Appendix appears normal. No evidence of bowel wall thickening,  distention, or inflammatory changes. There is diffuse colonic diverticulosis. Vascular/Lymphatic: No significant vascular findings are present. No enlarged abdominal or pelvic lymph nodes. Reproductive: There is a collapsing cyst or follicle in the left ovary measuring 2.4 cm. The right ovary and uterus are within normal limits. Other: There is a small amount of free fluid in the pelvic cul-de-sac. There is no focal abdominal wall hernia. Musculoskeletal: No acute or significant osseous findings. IMPRESSION: 1. Small amount of free fluid in the pelvic cul-de-sac. 2. Collapsing cyst or follicle in the left ovary. No follow-up imaging necessary. 3. Colonic diverticulosis without evidence for diverticulitis. Electronically Signed   By: Darliss Cheney M.D.   On: 06/19/2023 01:45    Medications Ordered in ED Medications  ondansetron (ZOFRAN-ODT) disintegrating tablet 4 mg (4 mg Oral Given 06/19/23 0215)  acetaminophen (TYLENOL) tablet 1,000 mg (1,000 mg Oral Given 06/19/23 0123)  hyoscyamine (LEVSIN SL) SL tablet 0.25 mg (0.25 mg Sublingual Given 06/19/23 0124)  sodium chloride 0.9 % bolus 1,000 mL (0 mLs Intravenous Stopped 06/19/23 0308)  iohexol (OMNIPAQUE) 300 MG/ML solution 100 mL (85 mLs Intravenous Contrast Given 06/19/23 0104)   Procedures Procedures  (including critical care time) Medical Decision Making / ED Course   Medical Decision Making Amount and/or Complexity of Data Reviewed Labs: ordered. Radiology: ordered.  Risk OTC drugs. Prescription drug management.    Left lower quadrant pain similar to prior diverticulitis. On review of records, patient's prior diverticulitis flares were not accompanied by leukocytosis. CBC without leukocytosis.  No anemia. Metabolic panel without significant electrolyte derangements or renal sufficiency.  No evidence of bili obstruction or pancreatitis. UA without evidence of infection or hematuria. UPT obtained to rule out pregnancy related process and  to assist in guiding care.  CT ordered to confirm or rule out diverticulitis instead of treating empirically given patient's history of prior C. difficile.  CT scan negative for intra-abdominal inflammatory/infectious process.  Patient does have a collapsing cyst/follicle in the left ovary which may represent a ruptured hemorrhagic cyst which would present similarly.  Low concern for ovarian torsion.  Patient provided with oral Tylenol and Levsin which provided significant relief.  Patient now pain-free.     Final Clinical Impression(s) / ED Diagnoses Final diagnoses:  LLQ pain   The patient appears reasonably screened and/or stabilized for discharge and I doubt any other medical condition or other [facility] requiring further screening, evaluation, or treatment in the ED at this time. I have discussed the findings, Dx and Tx plan with the patient/family who expressed understanding and agree(s) with the plan. Discharge instructions discussed at length. The patient/family was given strict return precautions who verbalized understanding of the instructions. No further questions at time of discharge.  Disposition: Discharge  Condition: Good  ED Discharge Orders     None         Follow Up: Jarold Motto, [place] [address] [place] [address] [phone]  Call  to schedule an appointment for close follow up    This chart was dictated using voice recognition software.  Despite best efforts to proofread,  errors can occur which can change the documentation meaning.    Nira Conn, MD 06/19/23 [phone]

## 2024-06-13 ENCOUNTER — Encounter: Payer: Self-pay | Admitting: Physician Assistant

## 2024-06-13 ENCOUNTER — Ambulatory Visit: Admitting: Physician Assistant

## 2024-06-13 VITALS — BP 120/80 | HR 69 | Temp 98.6°F | Ht 61.0 in | Wt 155.2 lb

## 2024-06-13 DIAGNOSIS — J329 Chronic sinusitis, unspecified: Secondary | ICD-10-CM

## 2024-06-13 DIAGNOSIS — J301 Allergic rhinitis due to pollen: Secondary | ICD-10-CM

## 2024-06-13 DIAGNOSIS — Z124 Encounter for screening for malignant neoplasm of cervix: Secondary | ICD-10-CM | POA: Diagnosis not present

## 2024-06-13 MED ORDER — IPRATROPIUM BROMIDE 0.03 % NA SOLN: 2.0000 | Freq: Two times a day (BID) | NASAL | 12 refills | Status: DC

## 2024-06-13 MED ORDER — MONTELUKAST SODIUM 10 MG PO TABS: 10.0000 mg | ORAL_TABLET | Freq: Every day | ORAL | 1 refills | Status: DC

## 2024-06-13 NOTE — Progress Notes (Signed)
 Alexandra Cortez is a 33 y.o. female here for a new problem.  History of Present Illness:   Chief Complaint  Patient presents with   Sinus Problem    Pt is having chronic sinus issues, sinus pressure, fluid in ear and dizziness. Pt would like to discuss referral to ENT.    Discussed the use of AI scribe software for clinical note transcription with the patient, who gave verbal consent to proceed.  History of Present Illness Alexandra Cortez is a 33 year old female who presents with chronic fluid in the ears and associated symptoms.  She experiences a sensation of chronic fluid in her ears, mainly in the left ear, with tinnitus and perceived hearing loss. There is no recent sinus infection. Increased fall allergies are present, along with intermittent lightheadedness and dizziness, often triggered by sneezing or rapid movements. No syncope occurs.  She has used daily antihistamines without improvement and currently uses Flonase as needed. Sinus pressure and headaches are relieved by Sudafed pain and pressure medication, though she limits its use. Daily nasal congestion is present, with no coughing.  She has not used Singulair  and avoids antibiotics due to gastrointestinal sensitivity. There are no recent altitude changes or travel.    Past Medical History:  Diagnosis Date   Allergy    Diverticulitis    GERD (gastroesophageal reflux disease)    Hematuria    History of shingles 2023   Left flank pain    Left ureteral calculus    Urgency of urination      Social History   Tobacco Use   Smoking status: Former    Current packs/day: 0.00    Average packs/day: 0.5 packs/day for 0.6 years (0.3 ttl pk-yrs)    Types: Cigarettes    Start date: 03/01/2012    Quit date: 10/06/2012    Years since quitting: 11.6   Smokeless tobacco: Never  Vaping Use   Vaping status: Every Day  Substance Use Topics   Alcohol use: Yes    Comment: OCCASIONAL   Drug use: Not Currently    Types:  Marijuana    Comment: used in the past    Past Surgical History:  Procedure Laterality Date   CYSTOSCOPY W/ RETROGRADES  06/14/2012   Procedure: CYSTOSCOPY WITH RETROGRADE PYELOGRAM;  Surgeon: Mark C Ottelin, MD;  Location: [facility];  Service: Urology;  Laterality: Left;   CYSTOSCOPY WITH URETEROSCOPY  06/14/2012   Procedure: CYSTOSCOPY WITH URETEROSCOPY;  Surgeon: Mark C Ottelin, MD;  Location: [facility];  Service: Urology;  Laterality: Left;   EXTRACORPOREAL SHOCK WAVE LITHOTRIPSY  01/05/2012   LEFT SIDE--  [facility]    TONSILLECTOMY  2018   WISDOM TOOTH EXTRACTION  10/06/2010    Family History  Problem Relation Age of Onset   Diverticulitis Mother    Hypertension Father    Arthritis Father    Alcohol abuse Father    Drug abuse Brother    Stroke Maternal Grandmother    Diabetes Maternal Grandmother    Obesity Maternal Grandmother    Esophageal cancer Maternal Grandfather    Breast cancer Paternal Grandmother 5   Colon cancer Neg Hx    Liver cancer Neg Hx    Pancreatic cancer Neg Hx    Rectal cancer Neg Hx    Stomach cancer Neg Hx     No Known Allergies  Current Medications:   Current Outpatient Medications:    dicyclomine  (BENTYL ) 10 MG capsule, Take 1 capsule (10  mg total) by mouth 4 (four) times daily -  before meals and at bedtime., Disp: 120 capsule, Rfl: 11   FLORASTOR 250 MG capsule, TAKE 1 CAPSULE(250 MG) BY MOUTH TWICE DAILY (Patient taking differently: Take 250 mg by mouth daily in the afternoon. Patient takes 1 capsule daily.), Disp: 60 capsule, Rfl: 6   fluticasone (FLONASE) 50 MCG/ACT nasal spray, Place 1 spray into both nostrils daily as needed for allergies or rhinitis., Disp: , Rfl:    ibuprofen (ADVIL,MOTRIN) 200 MG tablet, Take 600-800 mg by mouth every 6 (six) hours as needed for moderate pain., Disp: , Rfl:    ondansetron  (ZOFRAN ) 4 MG tablet, Take 1 tablet (4 mg total) by mouth every 8 (eight) hours as  needed for nausea or vomiting., Disp: 50 tablet, Rfl: 0   OVER THE COUNTER MEDICATION, Bee Pollen & Royal Jelly Supplement 1000 mg 2 tablets by mouth daily., Disp: , Rfl:    Review of Systems:   Negative unless otherwise specified per HPI.  Vitals:   Vitals:   06/13/24 1119  BP: 120/80  Pulse: 69  Temp: 98.6 F (37 C)  TempSrc: Temporal  SpO2: 99%  Weight: 155 lb 4 oz (70.4 kg)  Height: 5' 1 (1.549 m)     Body mass index is 29.33 kg/m.  Physical Exam:   Physical Exam Vitals and nursing note reviewed.  Constitutional:      General: She is not in acute distress.    Appearance: She is well-developed. She is not ill-appearing or toxic-appearing.  HENT:     Head: Normocephalic and atraumatic.     Right Ear: Tympanic membrane, ear canal and external ear normal. Tympanic membrane is not erythematous, retracted or bulging.     Left Ear: Ear canal and external ear normal. A middle ear effusion is present. Tympanic membrane is not erythematous, retracted or bulging.     Nose: Nose normal.     Right Sinus: No maxillary sinus tenderness or frontal sinus tenderness.     Left Sinus: No maxillary sinus tenderness or frontal sinus tenderness.     Mouth/Throat:     Pharynx: Uvula midline. No posterior oropharyngeal erythema.  Eyes:     General: Lids are normal.     Conjunctiva/sclera: Conjunctivae normal.  Neck:     Trachea: Trachea normal.  Cardiovascular:     Rate and Rhythm: Normal rate and regular rhythm.     Heart sounds: Normal heart sounds, S1 normal and S2 normal.  Pulmonary:     Effort: Pulmonary effort is normal.     Breath sounds: Normal breath sounds. No decreased breath sounds, wheezing, rhonchi or rales.  Lymphadenopathy:     Cervical: No cervical adenopathy.  Skin:    General: Skin is warm and dry.  Neurological:     Mental Status: She is alert.  Psychiatric:        Speech: Speech normal.        Behavior: Behavior normal. Behavior is cooperative.      Assessment and Plan:   Assessment and Plan Assessment & Plan Chronic congestion or paranasal sinus Chronic fluid in ears, more in left, with intermittent vertigo and hearing loss. Fluid clear, not infected. - Prescribe nasal antihistamine spray for Eustachian tube. - Recommend saline nasal spray before medicated spray. - Refer to ENT for evaluation and possible sinus CT.  Seasonal allergic rhinitis due to pollen Chronic nasal congestion and sneezing, worsened by fall allergies. Oral antihistamines ineffective. Flonase used as needed. Singulair  discussed  with black box warning for suicidality in teenagers. - Prescribe Singulair  as alternative treatment. - Discuss Singulair  side effects, including black box warning. - Advise monitoring for side effects and report concerns.  Follow-Up Discussed previous OB referral issues and interest in new OB center. - Attempt referral to Dr. Elvie Pinal at new [facility] on [facility]. - Follow up on Dr. Dianne acceptance of new patients.     Lucie Buttner, PA-C

## 2024-07-04 ENCOUNTER — Institutional Professional Consult (permissible substitution) (INDEPENDENT_AMBULATORY_CARE_PROVIDER_SITE_OTHER): Admitting: Otolaryngology

## 2024-07-11 ENCOUNTER — Other Ambulatory Visit: Payer: Self-pay | Admitting: Physician Assistant

## 2024-08-15 ENCOUNTER — Ambulatory Visit (INDEPENDENT_AMBULATORY_CARE_PROVIDER_SITE_OTHER)

## 2024-08-15 ENCOUNTER — Encounter (INDEPENDENT_AMBULATORY_CARE_PROVIDER_SITE_OTHER): Payer: Self-pay

## 2024-08-15 VITALS — BP 111/74 | HR 78 | Ht 61.0 in | Wt 150.0 lb

## 2024-08-15 DIAGNOSIS — H65492 Other chronic nonsuppurative otitis media, left ear: Secondary | ICD-10-CM | POA: Diagnosis not present

## 2024-08-15 DIAGNOSIS — H6993 Unspecified Eustachian tube disorder, bilateral: Secondary | ICD-10-CM

## 2024-08-15 DIAGNOSIS — H9192 Unspecified hearing loss, left ear: Secondary | ICD-10-CM | POA: Diagnosis not present

## 2024-08-15 NOTE — Progress Notes (Unsigned)
 Dear Dr. Job, Here is my assessment for our mutual patient, Alexandra Cortez. Thank you for allowing me the opportunity to care for your patient. Please do not hesitate to contact me should you have any other questions. Sincerely, Dr. Hadassah Parody  Otolaryngology Clinic Note Referring provider: Dr. Job HPI:   Initial HPI (08/15/2024) Discussed the use of AI scribe software for clinical note transcription with the patient, who gave verbal consent to proceed.  History of Present Illness Alexandra Cortez is a 33 year old female who presents with chronic ear issues and possible hearing loss.  Aural fullness and hearing loss - Chronic sensation of fluid in both ears for approximately five years and feels like worsening over time  - Fluid behind the ears confirmed by multiple physicians - Consistent muffled hearing, particularly in the left ear - Concern for hearing loss, more pronounced on the left side - Intermittent tinnitus - Symptoms worsen with weather changes - No otalgia - Occasionally able to perform Valsalva maneuver to pop ears, but not consistently effective - Regularly cleans ears with Q-tip  Independent Review of Additional Tests or Records:  Referral note Alexandra Job, PA-C: pt feels chronic fluid in ears and perceived hearing loss on that side    PMH/Meds/All/SocHx/FamHx/ROS:   Past Medical History:  Diagnosis Date   Allergy    Diverticulitis    GERD (gastroesophageal reflux disease)    Hematuria    History of shingles 2023   Left flank pain    Left ureteral calculus    Urgency of urination      Past Surgical History:  Procedure Laterality Date   CYSTOSCOPY W/ RETROGRADES  06/14/2012   Procedure: CYSTOSCOPY WITH RETROGRADE PYELOGRAM;  Surgeon: Mark C Ottelin, MD;  Location: [facility];  Service: Urology;  Laterality: Left;   CYSTOSCOPY WITH URETEROSCOPY  06/14/2012   Procedure: CYSTOSCOPY WITH URETEROSCOPY;  Surgeon: Mark C  Ottelin, MD;  Location: [facility];  Service: Urology;  Laterality: Left;   EXTRACORPOREAL SHOCK WAVE LITHOTRIPSY  01/05/2012   LEFT SIDE--  [facility]    TONSILLECTOMY  2018   WISDOM TOOTH EXTRACTION  10/06/2010    Family History  Problem Relation Age of Onset   Diverticulitis Mother    Hypertension Father    Arthritis Father    Alcohol abuse Father    Drug abuse Brother    Stroke Maternal Grandmother    Diabetes Maternal Grandmother    Obesity Maternal Grandmother    Esophageal cancer Maternal Grandfather    Breast cancer Paternal Grandmother 63   Colon cancer Neg Hx    Liver cancer Neg Hx    Pancreatic cancer Neg Hx    Rectal cancer Neg Hx    Stomach cancer Neg Hx      Social Connections: Moderately Isolated (06/12/2024)   Social Connection and Isolation Panel    Frequency of Communication with Friends and Family: More than three times a week    Frequency of Social Gatherings with Friends and Family: Once a week    Attends Religious Services: Never    Database Administrator or Organizations: No    Attends Engineer, Structural: Not on file    Marital Status: Living with partner     Current Outpatient Medications  Medication Instructions   dicyclomine  (BENTYL ) 10 mg, Oral, 3 times daily before meals & bedtime   FLORASTOR 250 MG capsule TAKE 1 CAPSULE(250 MG) BY MOUTH TWICE DAILY   fluticasone (FLONASE)  50 MCG/ACT nasal spray 1 spray, Daily PRN   ibuprofen (ADVIL) 600-800 mg, Every 6 hours PRN   ipratropium (ATROVENT ) 0.03 % nasal spray 2 sprays, Each Nare, Every 12 hours   montelukast  (SINGULAIR ) 10 MG tablet TAKE 1 TABLET(10 MG) BY MOUTH AT BEDTIME   ondansetron  (ZOFRAN ) 4 mg, Oral, Every 8 hours PRN   OVER THE COUNTER MEDICATION Bee Pollen & Royal Jelly Supplement 1000 mg 2 tablets by mouth daily.     Physical Exam:   BP 111/74 (BP Location: Right Arm, Patient Position: Sitting)   Pulse 78   Ht 5' 1 (1.549 m)   Wt 150 lb (68 kg)    LMP 07/19/2024 (Approximate)   SpO2 97%   BMI 28.34 kg/m   Salient findings:  CN II-XII intact Given history and complaints, ear microscopy was indicated and performed for evaluation with findings as below in physical exam section and in procedures  Bilateral EAC clear and TM intact. Right with air in ME. Left middle ear with small effusion.  Anterior rhinoscopy: Septum midline anteriorly; IT with hypertrophy   No lesions of oral cavity/oropharynx No obviously palpable neck masses/lymphadenopathy/thyromegaly No respiratory distress or stridor   Seprately Identifiable Procedures:  Prior to initiating any procedures, risks/benefits/alternatives were explained to the patient and verbal consent obtained.  Procedure (08/15/2024): Bilateral ear microscopy using microscope (CPT [id number]) Pre-procedure diagnosis: hearing loss, eustachian tube dysfunction  Post-procedure diagnosis: same Indication: see above; given patient's otologic complaints and history, for improved and comprehensive examination of external ear and tympanic membrane, bilateral otologic examination using microscope was performed. Prior to proceeding, verbal consent was obtained after discussion of R/B/A  Procedure: Patient was placed semi-recumbent. Both ear canals were examined using the microscope with findings above. Patient tolerated the procedure well.    Impression & Plans:  Alexandra Cortez is a 33 y.o. female with   1. Hearing loss of left ear, unspecified hearing loss type    Assessment and Plan Assessment & Plan Eustachian tube dysfunction with chronic otitis media with effusion Chronic eustachian tube dysfunction with fluid buildup, more pronounced in the left ear, causing pressure imbalance and fluid accumulation without expected permanent hearing loss. - Ordered hearing test to assess current hearing status. - Advised against frequent ear cleaning with Q-tips. - Will discuss tympanostomy tube placement  if continues to have effusion over several months   Suspected left ear hearing loss - Ordered hearing test to evaluate hearing loss in the left ear. - Scheduled follow-up appointment to review hearing test results and discuss management.   See below regarding exact medications prescribed this encounter including dosages and route: No orders of the defined types were placed in this encounter.   Thank you for allowing me the opportunity to care for your patient. Please do not hesitate to contact me should you have any other questions.  Sincerely, Hadassah Parody, MD Otolaryngologist (ENT), [facility] Phone: [phone] Fax: [phone]

## 2024-08-22 ENCOUNTER — Encounter: Payer: Self-pay | Admitting: Physician Assistant

## 2024-08-22 ENCOUNTER — Ambulatory Visit (INDEPENDENT_AMBULATORY_CARE_PROVIDER_SITE_OTHER): Admitting: Physician Assistant

## 2024-08-22 VITALS — BP 110/74 | HR 85 | Temp 98.1°F | Ht 61.0 in | Wt 152.2 lb

## 2024-08-22 DIAGNOSIS — Z136 Encounter for screening for cardiovascular disorders: Secondary | ICD-10-CM

## 2024-08-22 DIAGNOSIS — Z23 Encounter for immunization: Secondary | ICD-10-CM | POA: Diagnosis not present

## 2024-08-22 DIAGNOSIS — K589 Irritable bowel syndrome without diarrhea: Secondary | ICD-10-CM | POA: Diagnosis not present

## 2024-08-22 DIAGNOSIS — Z0000 Encounter for general adult medical examination without abnormal findings: Secondary | ICD-10-CM | POA: Diagnosis not present

## 2024-08-22 DIAGNOSIS — H6993 Unspecified Eustachian tube disorder, bilateral: Secondary | ICD-10-CM

## 2024-08-22 DIAGNOSIS — K760 Fatty (change of) liver, not elsewhere classified: Secondary | ICD-10-CM

## 2024-08-22 DIAGNOSIS — Z1159 Encounter for screening for other viral diseases: Secondary | ICD-10-CM

## 2024-08-22 DIAGNOSIS — Z114 Encounter for screening for human immunodeficiency virus [HIV]: Secondary | ICD-10-CM

## 2024-08-22 DIAGNOSIS — Z13220 Encounter for screening for lipoid disorders: Secondary | ICD-10-CM

## 2024-08-22 DIAGNOSIS — L680 Hirsutism: Secondary | ICD-10-CM

## 2024-08-22 LAB — CBC WITH DIFFERENTIAL/PLATELET
Basophils Absolute: 0.1 K/uL (ref 0.0–0.1)
Basophils Relative: 0.8 % (ref 0.0–3.0)
Eosinophils Absolute: 0.1 K/uL (ref 0.0–0.7)
Eosinophils Relative: 1.2 % (ref 0.0–5.0)
HCT: 42 % (ref 36.0–46.0)
Hemoglobin: 13.8 g/dL (ref 12.0–15.0)
Lymphocytes Relative: 23.6 % (ref 12.0–46.0)
Lymphs Abs: 1.8 K/uL (ref 0.7–4.0)
MCHC: 33 g/dL (ref 30.0–36.0)
MCV: 85.8 fl (ref 78.0–100.0)
Monocytes Absolute: 0.4 K/uL (ref 0.1–1.0)
Monocytes Relative: 5 % (ref 3.0–12.0)
Neutro Abs: 5.2 K/uL (ref 1.4–7.7)
Neutrophils Relative %: 69.4 % (ref 43.0–77.0)
Platelets: 233 K/uL (ref 150.0–400.0)
RBC: 4.89 Mil/uL (ref 3.87–5.11)
RDW: 12.9 % (ref 11.5–15.5)
WBC: 7.5 K/uL (ref 4.0–10.5)

## 2024-08-22 MED ORDER — ONDANSETRON HCL 4 MG PO TABS: 4.0000 mg | ORAL_TABLET | Freq: Three times a day (TID) | ORAL | 1 refills | Status: AC | PRN

## 2024-08-22 MED ORDER — DICYCLOMINE HCL 10 MG PO CAPS: 10.0000 mg | ORAL_CAPSULE | Freq: Two times a day (BID) | ORAL | 3 refills | Status: AC

## 2024-08-22 NOTE — Progress Notes (Signed)
 Subjective:    Malorie Bigford is a 33 y.o. female and is here for a comprehensive physical exam.  HPI  Health Maintenance Due  Topic Date Due   HIV Screening  Never done   Hepatitis C Screening  Never done    Discussed the use of AI scribe software for clinical note transcription with the patient, who gave verbal consent to proceed.  History of Present Illness   Edelmira Gallogly is a 33 year old female who presents with ear congestion and hearing concerns.  She experiences ongoing ear congestion and potential hearing loss. An ENT evaluation revealed fluid in her ears, suggesting eustachian tube dysfunction. A hearing test is scheduled for the end of December.  She discontinued Singulair  due to restlessness and sweating affecting her sleep. A nasal spray for decongestion caused dryness and soreness, possibly due to incorrect usage.  She is concerned about potential PCOS due to significant skin issues and increased facial hair over the past one to two years. Her menstrual cycles have become more regular recently.  She has a history of fatty liver and was found to have ovarian cysts during a hospital visit in September. She takes Bentyl  and Zofran  as needed, with Zofran  nearly depleted and Bentyl  out of date.  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 She rarely drinks alcohol and quit smoking in 2014. She experiences cold fingers and toes, sometimes painful, especially when lying down, and tingling in her feet since having COVID. No unusual headaches, but sinus-related headaches occur. No joint issues, tremor, numbness, or tingling, except for cold fingers and toes with occasional pain and tingling in feet since COVID.       Health Maintenance: Immunizations -- UpToDate; received flu shot today Colonoscopy -- N/A  Mammogram -- N/A  PAP -- overdue -- gynecology referral placed Bone Density -- n/a Diet -- overall  working on healthier diet Exercise -- limited currently but working on increasing exercise  Sleep habits -- improved Mood -- improved  UTD with dentist? - yes UTD with eye doctor? - no  Weight history: Wt Readings from Last 10 Encounters:  08/22/24 152 lb 4 oz (69.1 kg)  08/15/24 150 lb (68 kg)  06/13/24 155 lb 4 oz (70.4 kg)  06/18/23 150 lb (68 kg)  05/05/23 152 lb 6 oz (69.1 kg)  12/22/22 151 lb 4 oz (68.6 kg)  12/17/22 155 lb (70.3 kg)  10/29/22 148 lb (67.1 kg)  08/10/20 139 lb (63 kg)  12/19/19 150 lb 6 oz (68.2 kg)   Body mass index is 28.77 kg/m. Patient's last menstrual period was 08/21/2024 (exact date).  Alcohol use:  reports current alcohol use.  Tobacco use:  Tobacco Use: Medium Risk (08/22/2024)   Patient History    Smoking Tobacco Use: Former    Smokeless Tobacco Use: Never    Passive Exposure: Not on file   Eligible for lung cancer screening? no     08/22/2024    1:04 PM  Depression screen PHQ 2/9  Decreased Interest 0  Down, Depressed, Hopeless 0  PHQ - 2 Score 0     Other providers/specialists: Patient Care Team: Job Lukes, [place] as PCP - General (Physician Assistant)    PMHx, SurgHx, SocialHx, Medications, and Allergies were reviewed in the Visit Navigator and updated as appropriate.   Past Medical History:  Diagnosis Date   Allergy    Diverticulitis    GERD (gastroesophageal reflux  disease)    Hematuria    History of shingles 2023   Left flank pain    Left ureteral calculus    Urgency of urination      Past Surgical History:  Procedure Laterality Date   CYSTOSCOPY W/ RETROGRADES  06/14/2012   Procedure: CYSTOSCOPY WITH RETROGRADE PYELOGRAM;  Surgeon: Mark C Ottelin, MD;  Location: [facility];  Service: Urology;  Laterality: Left;   CYSTOSCOPY WITH URETEROSCOPY  06/14/2012   Procedure: CYSTOSCOPY WITH URETEROSCOPY;  Surgeon: Mark C Ottelin, MD;  Location: [facility];  Service: Urology;   Laterality: Left;   EXTRACORPOREAL SHOCK WAVE LITHOTRIPSY  01/05/2012   LEFT SIDE--  [facility]    TONSILLECTOMY  2018   WISDOM TOOTH EXTRACTION  10/06/2010     Family History  Problem Relation Age of Onset   Diverticulitis Mother    Diabetes type II Mother    Hypertension Father    Arthritis Father    Alcohol abuse Father    Drug abuse Brother    Stroke Maternal Grandmother    Diabetes Maternal Grandmother    Obesity Maternal Grandmother    Esophageal cancer Maternal Grandfather    Breast cancer Paternal Grandmother 68   Breast cancer Maternal Aunt        possible   Colon cancer Neg Hx    Liver cancer Neg Hx    Pancreatic cancer Neg Hx    Rectal cancer Neg Hx    Stomach cancer Neg Hx     Social History   Tobacco Use   Smoking status: Former    Current packs/day: 0.00    Average packs/day: 0.5 packs/day for 0.6 years (0.3 ttl pk-yrs)    Types: Cigarettes    Start date: 03/01/2012    Quit date: 10/06/2012    Years since quitting: 11.8   Smokeless tobacco: Never  Vaping Use   Vaping status: Every Day  Substance Use Topics   Alcohol use: Yes    Comment: OCCASIONAL   Drug use: Not Currently    Types: Marijuana    Comment: used in the past    Review of Systems:   Review of Systems  Constitutional:  Negative for chills, fever, malaise/fatigue and weight loss.  HENT:  Negative for hearing loss, sinus pain and sore throat.   Respiratory:  Negative for cough and hemoptysis.   Cardiovascular:  Negative for chest pain, palpitations, leg swelling and PND.  Gastrointestinal:  Negative for abdominal pain, constipation, diarrhea, heartburn, nausea and vomiting.  Genitourinary:  Negative for dysuria, frequency and urgency.  Musculoskeletal:  Negative for back pain, myalgias and neck pain.  Skin:  Negative for itching and rash.  Neurological:  Negative for dizziness, tingling, seizures and headaches.  Endo/Heme/Allergies:  Negative for polydipsia.   Psychiatric/Behavioral:  Negative for depression. The patient is not nervous/anxious.     Objective:   BP 110/74 (BP Location: Left Arm, Patient Position: Sitting, Cuff Size: Normal)   Pulse 85   Temp 98.1 F (36.7 C) (Temporal)   Ht 5' 1 (1.549 m)   Wt 152 lb 4 oz (69.1 kg)   LMP 08/21/2024 (Exact Date)   BMI 28.77 kg/m  Body mass index is 28.77 kg/m.   General Appearance:    Alert, cooperative, no distress, appears stated age  Head:    Normocephalic, without obvious abnormality, atraumatic  Eyes:    PERRL, conjunctiva/corneas clear, EOM's intact, fundi    benign, both eyes  Ears:  Normal TM's and external ear canals, both ears  Nose:   Nares normal, septum midline, mucosa normal, no drainage    or sinus tenderness  Throat:   Lips, mucosa, and tongue normal; teeth and gums normal  Neck:   Supple, symmetrical, trachea midline, no adenopathy;    thyroid :  no enlargement/tenderness/nodules; no carotid   bruit or JVD  Back:     Symmetric, no curvature, ROM normal, no CVA tenderness  Lungs:     Clear to auscultation bilaterally, respirations unlabored  Chest Wall:    No tenderness or deformity   Heart:    Regular rate and rhythm, S1 and S2 normal, no murmur, rub or gallop  Breast Exam:    Deferred  Abdomen:     Soft, non-tender, bowel sounds active all four quadrants,    no masses, no organomegaly  Genitalia:    Deferred   Extremities:   Extremities normal, atraumatic, no cyanosis or edema  Pulses:   2+ and symmetric all extremities  Skin:   Skin color, texture, turgor normal, no rashes or lesions  Lymph nodes:   Cervical, supraclavicular, and axillary nodes normal  Neurologic:   CNII-XII intact, normal strength, sensation and reflexes    throughout    Assessment/Plan:   Assessment and Plan    Comprehensive Physical Exam (CPE) preventive care annual visit Today patient counseled on age appropriate routine health concerns for screening and prevention, each reviewed  and up to date or declined. Immunizations reviewed and up to date or declined. Labs ordered and reviewed. Risk factors for depression reviewed and negative. Hearing function and visual acuity are intact. ADLs screened and addressed as needed. Functional ability and level of safety reviewed and appropriate. Education, counseling and referrals performed based on assessed risks today. Patient provided with a copy of personalized plan for preventive services.  Eustachian tube dysfunction with middle ear effusion Fluid in ears with muffled hearing and possible hearing loss. ENT evaluation suggests eustachian tube dysfunction. Hearing test scheduled for end of December. - Proceed with scheduled hearing test at the end of December. - Consider tympanostomy tube placement if effusion persists.  Hirsutism Suspected polycystic ovary syndrome (PCOS) with history of ovarian cysts Irregular periods, facial hair, acne, and ovarian cysts suggest high suspicion for PCOS. Discussed potential treatments including metformin, spironolactone, and birth control pills. - She plans to follow up with gynecology for this - Discuss treatment options with gynecologist if needed.  Irritable bowel syndrome Managed with Bentyl  and Zofran  as needed. - Refilled Bentyl  with instructions for BID use. - Refilled Zofran  4 mg.  Fatty liver disease Previous recommendation to reduce carbohydrates and recheck liver enzymes in six months. - Ordered liver enzyme tests. - Advised reduction of carbohydrate intake.  General Health Maintenance Routine dental appointments maintained. Eye exam overdue. Difficulty establishing care with OB GYN. Discussed potential PCOS and its implications on fertility. - Continue routine dental appointments. - Schedule overdue eye exam. - Follow up with OB GYN for potential PCOS evaluation.           Lucie Buttner, PA-C [facility] [place]

## 2024-08-23 LAB — LIPID PANEL
Cholesterol: 186 mg/dL (ref 0–200)
HDL: 58.4 mg/dL (ref >39.00)
LDL Cholesterol: 115 mg/dL — ABNORMAL HIGH (ref 0–99)
NonHDL: 127.88
Total CHOL/HDL Ratio: 3
Triglycerides: 64 mg/dL (ref 0.0–149.0)
VLDL: 12.8 mg/dL (ref 0.0–40.0)

## 2024-08-23 LAB — COMPREHENSIVE METABOLIC PANEL WITH GFR
ALT: 12 U/L (ref 0–35)
AST: 13 U/L (ref 0–37)
Albumin: 4.7 g/dL (ref 3.5–5.2)
Alkaline Phosphatase: 40 U/L (ref 39–117)
BUN: 12 mg/dL (ref 6–23)
CO2: 24 meq/L (ref 19–32)
Calcium: 9.2 mg/dL (ref 8.4–10.5)
Chloride: 104 meq/L (ref 96–112)
Creatinine, Ser: 0.69 mg/dL (ref 0.40–1.20)
GFR: 113.9 mL/min (ref >60.00)
Glucose, Bld: 78 mg/dL (ref 70–99)
Potassium: 4.2 meq/L (ref 3.5–5.1)
Sodium: 139 meq/L (ref 135–145)
Total Bilirubin: 0.5 mg/dL (ref 0.2–1.2)
Total Protein: 6.8 g/dL (ref 6.0–8.3)

## 2024-08-23 LAB — HIV ANTIBODY (ROUTINE TESTING W REFLEX)
HIV 1&2 Ab, 4th Generation: NONREACTIVE
HIV FINAL INTERPRETATION: NEGATIVE

## 2024-08-23 LAB — HEMOGLOBIN A1C: Hgb A1c MFr Bld: 5.2 % (ref 4.6–6.5)

## 2024-08-23 LAB — HEPATITIS C ANTIBODY: Hepatitis C Ab: NONREACTIVE

## 2024-08-24 ENCOUNTER — Ambulatory Visit: Payer: Self-pay | Admitting: Physician Assistant

## 2024-09-26 ENCOUNTER — Ambulatory Visit (INDEPENDENT_AMBULATORY_CARE_PROVIDER_SITE_OTHER)

## 2024-09-26 ENCOUNTER — Ambulatory Visit (INDEPENDENT_AMBULATORY_CARE_PROVIDER_SITE_OTHER): Admitting: Audiology

## 2024-11-21 ENCOUNTER — Encounter (HOSPITAL_BASED_OUTPATIENT_CLINIC_OR_DEPARTMENT_OTHER): Admitting: Certified Nurse Midwife

## 2025-08-28 ENCOUNTER — Encounter: Admitting: Physician Assistant

## 2025-09-11 ENCOUNTER — Encounter: Admitting: Physician Assistant
